# Patient Record
Sex: Female | Born: 1955 | Race: White | Hispanic: No | Marital: Married | Smoking: Never smoker
Health system: Southern US, Community
[De-identification: ages and names within clinical notes are randomized; demographics above are authoritative.]

## PROBLEM LIST (undated history)

## (undated) DIAGNOSIS — Z9889 Other specified postprocedural states: Secondary | ICD-10-CM

## (undated) DIAGNOSIS — E039 Hypothyroidism, unspecified: Secondary | ICD-10-CM

## (undated) DIAGNOSIS — E559 Vitamin D deficiency, unspecified: Secondary | ICD-10-CM

## (undated) DIAGNOSIS — N393 Stress incontinence (female) (male): Secondary | ICD-10-CM

## (undated) DIAGNOSIS — C50919 Malignant neoplasm of unspecified site of unspecified female breast: Secondary | ICD-10-CM

## (undated) DIAGNOSIS — N952 Postmenopausal atrophic vaginitis: Secondary | ICD-10-CM

## (undated) DIAGNOSIS — N951 Menopausal and female climacteric states: Secondary | ICD-10-CM

## (undated) DIAGNOSIS — R112 Nausea with vomiting, unspecified: Secondary | ICD-10-CM

## (undated) DIAGNOSIS — E162 Hypoglycemia, unspecified: Secondary | ICD-10-CM

## (undated) DIAGNOSIS — D069 Carcinoma in situ of cervix, unspecified: Secondary | ICD-10-CM

## (undated) DIAGNOSIS — J309 Allergic rhinitis, unspecified: Secondary | ICD-10-CM

## (undated) DIAGNOSIS — M1990 Unspecified osteoarthritis, unspecified site: Secondary | ICD-10-CM

## (undated) DIAGNOSIS — M8580 Other specified disorders of bone density and structure, unspecified site: Secondary | ICD-10-CM

## (undated) DIAGNOSIS — J302 Other seasonal allergic rhinitis: Secondary | ICD-10-CM

## (undated) DIAGNOSIS — E042 Nontoxic multinodular goiter: Secondary | ICD-10-CM

## (undated) DIAGNOSIS — E7800 Pure hypercholesterolemia, unspecified: Secondary | ICD-10-CM

## (undated) HISTORY — PX: CERVICAL CONE BIOPSY: SUR198

## (undated) HISTORY — DX: Other seasonal allergic rhinitis: J30.2

## (undated) HISTORY — PX: CERVICAL BIOPSY  W/ LOOP ELECTRODE EXCISION: SUR135

## (undated) HISTORY — DX: Menopausal and female climacteric states: N95.1

## (undated) HISTORY — DX: Postmenopausal atrophic vaginitis: N95.2

## (undated) HISTORY — DX: Allergic rhinitis, unspecified: J30.9

## (undated) HISTORY — DX: Other specified disorders of bone density and structure, unspecified site: M85.80

## (undated) HISTORY — DX: Hypothyroidism, unspecified: E03.9

## (undated) HISTORY — DX: Stress incontinence (female) (male): N39.3

## (undated) HISTORY — PX: BREAST LUMPECTOMY: SHX2

## (undated) HISTORY — DX: Nontoxic multinodular goiter: E04.2

## (undated) HISTORY — DX: Malignant neoplasm of unspecified site of unspecified female breast: C50.919

## (undated) HISTORY — DX: Carcinoma in situ of cervix, unspecified: D06.9

## (undated) HISTORY — DX: Unspecified osteoarthritis, unspecified site: M19.90

## (undated) HISTORY — DX: Pure hypercholesterolemia, unspecified: E78.00

## (undated) HISTORY — DX: Vitamin D deficiency, unspecified: E55.9

## (undated) HISTORY — DX: Hypoglycemia, unspecified: E16.2

---

## 2004-07-19 ENCOUNTER — Ambulatory Visit: Payer: Self-pay | Admitting: Unknown Physician Specialty

## 2004-07-20 ENCOUNTER — Ambulatory Visit: Payer: Self-pay | Admitting: Unknown Physician Specialty

## 2006-12-14 ENCOUNTER — Ambulatory Visit: Payer: Self-pay | Admitting: Unknown Physician Specialty

## 2007-03-27 ENCOUNTER — Ambulatory Visit: Payer: Self-pay | Admitting: Unknown Physician Specialty

## 2007-05-09 ENCOUNTER — Ambulatory Visit (HOSPITAL_COMMUNITY): Admission: RE | Admit: 2007-05-09 | Discharge: 2007-05-09 | Payer: Self-pay | Admitting: Gastroenterology

## 2007-05-20 ENCOUNTER — Ambulatory Visit: Payer: Self-pay | Admitting: Gastroenterology

## 2008-05-01 ENCOUNTER — Encounter: Payer: Self-pay | Admitting: Gastroenterology

## 2008-07-03 ENCOUNTER — Telehealth: Payer: Self-pay | Admitting: Gastroenterology

## 2008-08-07 HISTORY — PX: MASTECTOMY: SHX3

## 2008-08-07 HISTORY — PX: OTHER SURGICAL HISTORY: SHX169

## 2008-08-25 ENCOUNTER — Telehealth: Payer: Self-pay | Admitting: Gastroenterology

## 2008-09-15 ENCOUNTER — Encounter: Payer: Self-pay | Admitting: Gastroenterology

## 2010-07-20 ENCOUNTER — Ambulatory Visit: Payer: Self-pay | Admitting: Unknown Physician Specialty

## 2010-08-23 ENCOUNTER — Ambulatory Visit: Payer: Self-pay | Admitting: Unknown Physician Specialty

## 2010-08-25 LAB — PATHOLOGY REPORT

## 2010-09-06 NOTE — Procedures (Signed)
Summary: Recall / [facility]  Recall / [facility]   Imported By: Lennie Odor 01/07/2010 14:13:36  _____________________________________________________________________  External Attachment:    Type:   Image     Comment:   External Document

## 2011-08-28 ENCOUNTER — Ambulatory Visit: Payer: Self-pay | Admitting: Unknown Physician Specialty

## 2012-04-30 DIAGNOSIS — C50919 Malignant neoplasm of unspecified site of unspecified female breast: Secondary | ICD-10-CM | POA: Insufficient documentation

## 2012-10-30 ENCOUNTER — Ambulatory Visit: Payer: Self-pay | Admitting: Unknown Physician Specialty

## 2013-02-19 ENCOUNTER — Ambulatory Visit: Payer: Self-pay | Admitting: Orthopedic Surgery

## 2014-06-25 LAB — HM PAP SMEAR: HM PAP: NEGATIVE

## 2015-05-07 ENCOUNTER — Ambulatory Visit: Payer: Self-pay | Admitting: Physician Assistant

## 2015-05-07 DIAGNOSIS — D510 Vitamin B12 deficiency anemia due to intrinsic factor deficiency: Secondary | ICD-10-CM

## 2015-05-07 MED ORDER — CYANOCOBALAMIN 1000 MCG/ML IJ SOLN
1000.0000 ug | Freq: Once | INTRAMUSCULAR | Status: AC
  Administered 2015-05-07: 1000 ug via INTRAMUSCULAR

## 2015-05-20 ENCOUNTER — Ambulatory Visit (INDEPENDENT_AMBULATORY_CARE_PROVIDER_SITE_OTHER): Payer: PRIVATE HEALTH INSURANCE | Admitting: Obstetrics and Gynecology

## 2015-05-20 ENCOUNTER — Encounter: Payer: Self-pay | Admitting: Obstetrics and Gynecology

## 2015-05-20 VITALS — BP 92/60 | HR 78 | Ht 66.0 in | Wt 131.6 lb

## 2015-05-20 DIAGNOSIS — L298 Other pruritus: Secondary | ICD-10-CM

## 2015-05-20 DIAGNOSIS — Z87410 Personal history of cervical dysplasia: Secondary | ICD-10-CM

## 2015-05-20 DIAGNOSIS — C50919 Malignant neoplasm of unspecified site of unspecified female breast: Secondary | ICD-10-CM | POA: Diagnosis not present

## 2015-05-20 DIAGNOSIS — N898 Other specified noninflammatory disorders of vagina: Secondary | ICD-10-CM

## 2015-05-20 DIAGNOSIS — N952 Postmenopausal atrophic vaginitis: Secondary | ICD-10-CM | POA: Diagnosis not present

## 2015-05-20 DIAGNOSIS — N760 Acute vaginitis: Secondary | ICD-10-CM | POA: Diagnosis not present

## 2015-05-20 DIAGNOSIS — Z780 Asymptomatic menopausal state: Secondary | ICD-10-CM | POA: Insufficient documentation

## 2015-05-20 DIAGNOSIS — N393 Stress incontinence (female) (male): Secondary | ICD-10-CM

## 2015-05-20 DIAGNOSIS — M8580 Other specified disorders of bone density and structure, unspecified site: Secondary | ICD-10-CM | POA: Diagnosis not present

## 2015-05-20 HISTORY — DX: Stress incontinence (female) (male): N39.3

## 2015-05-20 MED ORDER — NYSTATIN-TRIAMCINOLONE 100000-0.1 UNIT/GM-% EX OINT: 1.0000 "application " | TOPICAL_OINTMENT | Freq: Two times a day (BID) | CUTANEOUS | Status: DC

## 2015-05-20 MED ORDER — TERCONAZOLE 0.4 % VA CREA: 1.0000 | TOPICAL_CREAM | Freq: Every day | VAGINAL | Status: DC

## 2015-05-20 NOTE — Patient Instructions (Signed)
1.  The wet prep today did not reveal any obvious source of infection. 2.  A Nu Swab test was sent off. You will be notified of results. 3.  Use the Terazol cream intravaginal daily for 7 days. 4.  Used the Mycolog cream twice a day.  Externally for 14 days. 5.  Return in December for follow-up.

## 2015-05-20 NOTE — Progress Notes (Signed)
Chief complaint: 1.  Recurrent vulvar itching.  SUBJECTIVE: The patient is a 59 year old white female, menopausal, on no HRT therapy,with history of breast cancer, on tamoxifen therapy, who presents for evaluation of recurrent vulvar itching with minimal vaginal discharge.  Several months ago she was treated with an antibiotic.  Since that time she has had recurrent vulvar symptoms, which required treatment with Diflucan twice and Monistat once.  Symptoms typically resolve for a short duration, but then return.  Bladder function is normal.  Bowel function is normal.  Past medical history: Past surgical history, problem list, medications, and allergies are reviewed.  ROS of systems: Per HPI.  OBJECTIVE: BP 92/60 mmHg  Pulse 78  Ht 5\' 6"  (1.676 m)  Wt 131 lb 9.6 oz (59.693 kg)  BMI 21.25 kg/m2 Patient is a pleasant, well-appearing white female in no acute distress. 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  No obvious leukoplakia or abnormal appearing lesions; no epithelial skin breakdown or ulceration; introitus appears normal. BUS: Normal Vagina: Atrophic changes; minimal thin white discharge present.  PROCEDURE: Wet prep-normal saline: Normal epithelial cells, no white blood cells, no Trichomonas.  KOH: No hyphae  IMPRESSION: 1.  Recurrent vulvovaginitis, unclear etiology.  PLAN: 1.  Empiric treatment for monilia: Terazol 7 cream; Mycolog cream. 2.  Return in 1 month.  If symptoms persist. 3.  Return for follow-up in December as scheduled. 4.  Patient may require posterior fourchette biopsy to help confirm diagnosis if symptoms persist.  Brayton Mars, MD

## 2015-05-25 LAB — NUSWAB BV AND CANDIDA, NAA
CANDIDA ALBICANS, NAA: NEGATIVE
CANDIDA GLABRATA, NAA: NEGATIVE

## 2015-06-07 ENCOUNTER — Ambulatory Visit: Payer: Self-pay | Admitting: Physician Assistant

## 2015-06-07 ENCOUNTER — Other Ambulatory Visit: Payer: Self-pay | Admitting: Emergency Medicine

## 2015-06-07 DIAGNOSIS — E538 Deficiency of other specified B group vitamins: Secondary | ICD-10-CM

## 2015-06-07 MED ORDER — CYANOCOBALAMIN 1000 MCG/ML IJ SOLN: 1000.0000 ug | Freq: Once | INTRAMUSCULAR | Status: DC

## 2015-06-07 MED ORDER — CYANOCOBALAMIN 1000 MCG/ML IJ SOLN
1000.0000 ug | Freq: Once | INTRAMUSCULAR | Status: AC
  Administered 2015-06-07: 1000 ug via INTRAMUSCULAR

## 2015-06-07 NOTE — Progress Notes (Signed)
Patient ID: Ashley Hampton, female   DOB: October 02, 1955, 59 y.o.   MRN: [id number] B12 given in the left arm intramuscular.  Pt tolerated it well. Pt will return in 1 month for next scheduled injection.

## 2015-06-07 NOTE — Telephone Encounter (Signed)
Patient is going to pick up medication today and come to clinic for her injection.

## 2015-07-07 ENCOUNTER — Encounter: Payer: Self-pay | Admitting: Physician Assistant

## 2015-07-07 ENCOUNTER — Ambulatory Visit: Payer: Self-pay | Admitting: Physician Assistant

## 2015-07-07 VITALS — BP 100/60 | HR 85 | Temp 98.7°F

## 2015-07-07 DIAGNOSIS — B029 Zoster without complications: Secondary | ICD-10-CM

## 2015-07-07 DIAGNOSIS — E538 Deficiency of other specified B group vitamins: Secondary | ICD-10-CM

## 2015-07-07 MED ORDER — FAMCICLOVIR 500 MG PO TABS
500.0000 mg | ORAL_TABLET | Freq: Three times a day (TID) | ORAL | Status: DC
Start: 2015-07-07 — End: 2015-08-25

## 2015-07-07 MED ORDER — CYANOCOBALAMIN 1000 MCG/ML IJ SOLN
1000.0000 ug | Freq: Once | INTRAMUSCULAR | Status: AC
  Administered 2015-07-07: 1000 ug via INTRAMUSCULAR

## 2015-07-07 NOTE — Addendum Note (Signed)
Addended by: Rudene Anda T on: 07/07/2015 12:33 PM   Modules accepted: Orders

## 2015-07-07 NOTE — Progress Notes (Signed)
S: c/o rash on buttocks b/n cheeks, blistery looking, hx of shingles in past and it was around her waist, area is not very painful, no drainage, fever/chills associated with rash  O: vitals wnl, nad, skin on buttocks with vesicluar rash size of quarter, no drainage, n/v intact  A: shingles  P: famvir 500mg  tid x 7d

## 2015-07-13 ENCOUNTER — Encounter: Payer: Self-pay | Admitting: Obstetrics and Gynecology

## 2015-07-22 ENCOUNTER — Ambulatory Visit (INDEPENDENT_AMBULATORY_CARE_PROVIDER_SITE_OTHER): Payer: PRIVATE HEALTH INSURANCE | Admitting: Obstetrics and Gynecology

## 2015-07-22 ENCOUNTER — Encounter: Payer: Self-pay | Admitting: Obstetrics and Gynecology

## 2015-07-22 VITALS — BP 104/66 | HR 116 | Ht 65.0 in | Wt 139.0 lb

## 2015-07-22 DIAGNOSIS — Z01419 Encounter for gynecological examination (general) (routine) without abnormal findings: Secondary | ICD-10-CM

## 2015-07-22 DIAGNOSIS — Z853 Personal history of malignant neoplasm of breast: Secondary | ICD-10-CM | POA: Diagnosis not present

## 2015-07-22 DIAGNOSIS — R102 Pelvic and perineal pain: Secondary | ICD-10-CM | POA: Diagnosis not present

## 2015-07-22 DIAGNOSIS — Z87410 Personal history of cervical dysplasia: Secondary | ICD-10-CM

## 2015-07-22 NOTE — Progress Notes (Signed)
Patient ID: Ashley Hampton, female   DOB: 1955-11-13, 59 y.o.   MRN: [id number] ANNUAL PREVENTATIVE CARE GYN  ENCOUNTER NOTE  Subjective:       Ashley Hampton is a 59 y.o. G5P1001 female here for a routine annual gynecologic exam.  Current complaints: 1.  Pelvic pain more on left side   Gynecologic History No LMP recorded. Patient is postmenopausal. Contraception: post menopausal status Last Pap: 06/25/2014-[facility]/neg. Results were: abnormal Last mammogram: 2016- thru [facility]. Results were: normal  Obstetric History OB History  Gravida Para Term Preterm AB SAB TAB Ectopic Multiple Living  1 1 1       1     # Outcome Date GA Lbr Len/2nd Weight Sex Delivery Anes PTL Lv  1 Term      Vag-Spont   Y      Past Medical History  Diagnosis Date  . Severe dysplasia of cervix (CIN III)   . Vitamin D deficiency   . Hypoglycemia   . Anemia     pernicious  . Hypothyroid   . AR (allergic rhinitis)   . DJD (degenerative joint disease)   . Osteoporosis   . Multinodular goiter   . Hypercholesteremia   . Menopausal state   . Vaginal atrophy   . SUI (stress urinary incontinence, female) 05/20/2015  . Hypertension   . DJD (degenerative joint disease)   . Osteopenia   . Urinary incontinence   . Breast cancer [facility])     h/o- infiltrating ductal carcinoma    Past Surgical History  Procedure Laterality Date  . Cervical biopsy  w/ loop electrode excision    . Cesarean section    . Cervical cone biopsy    . Breast lumpectomy    . Mastectomy Right 2010    total - infiltrating ductal carcinoma    Current Outpatient Prescriptions on File Prior to Visit  Medication Sig Dispense Refill  . aspirin EC 81 MG tablet Take by mouth.    . calcium elemental as carbonate (PX ANTACID MAXIMUM STRENGTH) 400 MG chewable tablet Chew by mouth.    . celecoxib (CELEBREX) 200 MG capsule Take by mouth.    . cyanocobalamin (,VITAMIN B-12,) 1000 MCG/ML injection Inject 1 mL (1,000 mcg total) into the muscle once. 1 mL  11  . docusate sodium (STOOL SOFTENER) 100 MG capsule Take by mouth.    . famciclovir (FAMVIR) 500 MG tablet Take 1 tablet (500 mg total) by mouth 3 (three) times daily. 21 tablet 0  . fluticasone (FLONASE) 50 MCG/ACT nasal spray     . levothyroxine (SYNTHROID, LEVOTHROID) 88 MCG tablet Take by mouth.    . magnesium oxide (MAG-OX) 400 MG tablet Take by mouth.    . polyethylene glycol powder (GLYCOLAX/MIRALAX) powder Take by mouth.    . pravastatin (PRAVACHOL) 20 MG tablet Take by mouth.    . tamoxifen (NOLVADEX) 10 MG tablet Take by mouth.     No current facility-administered medications on file prior to visit.    Allergies  Allergen Reactions  . Penicillins   . Ampicillin Rash    Social History   Social History  . Marital Status: Married    Spouse Name: N/A  . Number of Children: N/A  . Years of Education: N/A   Occupational History  . Not on file.   Social History Main Topics  . Smoking status: Never Smoker   . Smokeless tobacco: Not on file  . Alcohol Use: Yes     Comment: weekly  .  Drug Use: No  . Sexual Activity: Yes    Birth Control/ Protection: Post-menopausal   Other Topics Concern  . Not on file   Social History Narrative    Family History  Problem Relation Age of Onset  . Lung cancer Mother   . Lung cancer Father   . Ovarian cancer Neg Hx   . Breast cancer Neg Hx   . Cancer Neg Hx   . Diabetes Neg Hx   . Heart disease Neg Hx     The following portions of the patient's history were reviewed and updated as appropriate: allergies, current medications, past family history, past medical history, past social history, past surgical history and problem list.  Review of Systems ROS Review of Systems - General ROS: negative for - chills, fatigue, fever, hot flashes, night sweats, weight gain or weight loss Psychological ROS: negative for - anxiety, decreased libido, depression, mood swings, physical abuse or sexual abuse Ophthalmic ROS: negative for -  blurry vision, eye pain or loss of vision ENT ROS: negative for - headaches, hearing change, visual changes or vocal changes Allergy and Immunology ROS: negative for - hives, itchy/watery eyes or seasonal allergies Hematological and Lymphatic ROS: negative for - bleeding problems, bruising, swollen lymph nodes or weight loss Endocrine ROS: negative for - galactorrhea, hair pattern changes, hot flashes, malaise/lethargy, mood swings, palpitations, polydipsia/polyuria, skin changes, temperature intolerance or unexpected weight changes Breast ROS: negative for - new or changing breast lumps or nipple discharge Respiratory ROS: negative for - cough or shortness of breath Cardiovascular ROS: negative for - chest pain, irregular heartbeat, palpitations or shortness of breath Gastrointestinal ROS: no abdominal pain, change in bowel habits, or black or bloody stools Genito-Urinary ROS: no dysuria, trouble voiding, or hematuria Musculoskeletal ROS: negative for - joint pain or joint stiffness Neurological ROS: negative for - bowel and bladder control changes Dermatological ROS: negative for rash and skin lesion changes   Objective:   BP 104/66 mmHg  Pulse 116  Ht 5\' 5"  (1.651 m)  Wt 139 lb (63.05 kg)  BMI 23.13 kg/m2 CONSTITUTIONAL: Well-developed, well-nourished female in no acute distress.  PSYCHIATRIC: Normal mood and affect. Normal behavior. Normal judgment and thought content. [place]: Alert and oriented to person, place, and time. Normal muscle tone coordination. No cranial nerve deficit noted. HENT:  Normocephalic, atraumatic, External right and left ear normal. Oropharynx is clear and moist EYES: Conjunctivae and EOM are normal. Pupils are equal, round, and reactive to light. No scleral icterus.  NECK: Normal range of motion, supple, no masses.  Normal thyroid.  SKIN: Skin is warm and dry. No rash noted. Not diaphoretic. No erythema. No pallor. CARDIOVASCULAR: Normal heart rate noted,  regular rhythm, no murmur. RESPIRATORY: Clear to auscultation bilaterally. Effort and breath sounds normal, no problems with respiration noted. BREASTS: Right breast reconstruction changes; Nontender, no masses. No masses, skin changes, nipple drainage, or lymphadenopathy. ABDOMEN: Soft, normal bowel sounds, no distention noted.  No tenderness, rebound or guarding.  BLADDER: Normal PELVIC:  External Genitalia: Normal  BUS: Normal  Vagina: Moderate atrophy  Cervix: Scarred; no cervical motion tenderness  Uterus: Normal; Small, mobile, nontender  Adnexa: Normal; Nonpalpable, nontender  RV: External Exam NormaI, No Rectal Masses and Normal Sphincter tone  MUSCULOSKELETAL: Normal range of motion. No tenderness.  No cyanosis, clubbing, or edema.  2+ distal pulses. LYMPHATIC: No Axillary, Supraclavicular, or Inguinal Adenopathy.    Assessment/Plan   Annual gynecologic examination 59 y.o. Contraception: post menopausal status bmi-23 Vaginal atrophy Pelvic  pain, bilateral; normal pelvic exam History of breast cancer. History of CIN-3, status post excision;;Last year Pap smear ASCUS/negative  Pap:Pap smear//HPV Mammogram: thru [facility] Stool Guaiac Testing:  will see dr Tiffany Kocher soon Labs: thru pcp Routine preventative health maintenance measures emphasized: Exercise/Diet/Weight control, Tobacco Warnings and Alcohol/Substance use risks Pelvic ultrasound Lubricants when necessary Return to [place], CMA  Brayton Mars, MD  Note: This dictation was prepared with Dragon dictation along with smaller phrase technology. Any transcriptional errors that result from this process are unintentional.

## 2015-07-23 NOTE — Patient Instructions (Signed)
1.  Pap smear is completed today. 2.  Mammograms through [facility]. 3.  Patient to have colonoscopy; stool guaiac cards are not ven. 4.  Continue with calcium and vitamin D supplementation. 5.  Pelvic ultrasound To assess pelvic pain (pelvic exam normal)  6.  Return in 1 year

## 2015-07-28 LAB — PAP IG AND HPV HIGH-RISK
HPV, high-risk: NEGATIVE
PAP SMEAR COMMENT: 0

## 2015-08-03 ENCOUNTER — Ambulatory Visit (INDEPENDENT_AMBULATORY_CARE_PROVIDER_SITE_OTHER): Payer: PRIVATE HEALTH INSURANCE

## 2015-08-03 DIAGNOSIS — Z853 Personal history of malignant neoplasm of breast: Secondary | ICD-10-CM | POA: Diagnosis not present

## 2015-08-03 DIAGNOSIS — R102 Pelvic and perineal pain: Secondary | ICD-10-CM | POA: Diagnosis not present

## 2015-08-05 ENCOUNTER — Telehealth: Payer: Self-pay | Admitting: Obstetrics and Gynecology

## 2015-08-05 NOTE — Telephone Encounter (Signed)
Pt notified that Dr. Enzo Bi out of office and will return on Tuesday. Msg sent to CM for review.

## 2015-08-05 NOTE — Telephone Encounter (Signed)
Pt came Tuesday for [place] and she wanted to now results

## 2015-08-06 ENCOUNTER — Ambulatory Visit: Payer: Self-pay

## 2015-08-06 DIAGNOSIS — E538 Deficiency of other specified B group vitamins: Secondary | ICD-10-CM

## 2015-08-06 MED ORDER — CYANOCOBALAMIN 1000 MCG/ML IJ SOLN
1000.0000 ug | Freq: Once | INTRAMUSCULAR | Status: AC
  Administered 2015-08-06: 1000 ug via INTRAMUSCULAR

## 2015-08-06 NOTE — Progress Notes (Unsigned)
Patient ID: Ashley Hampton, female   DOB: [birth year], 59 y.o.   MRN: [id number] Patient came in to have her monthly b12 injection.

## 2015-08-10 ENCOUNTER — Ambulatory Visit (INDEPENDENT_AMBULATORY_CARE_PROVIDER_SITE_OTHER): Payer: Managed Care, Other (non HMO) | Admitting: Obstetrics and Gynecology

## 2015-08-10 ENCOUNTER — Telehealth: Payer: Self-pay | Admitting: Obstetrics and Gynecology

## 2015-08-10 ENCOUNTER — Encounter: Payer: Self-pay | Admitting: Obstetrics and Gynecology

## 2015-08-10 VITALS — BP 107/72 | HR 98 | Ht 65.0 in | Wt 138.0 lb

## 2015-08-10 DIAGNOSIS — R102 Pelvic and perineal pain: Secondary | ICD-10-CM | POA: Diagnosis not present

## 2015-08-10 DIAGNOSIS — R938 Abnormal findings on diagnostic imaging of other specified body structures: Secondary | ICD-10-CM

## 2015-08-10 DIAGNOSIS — Z853 Personal history of malignant neoplasm of breast: Secondary | ICD-10-CM | POA: Diagnosis not present

## 2015-08-10 DIAGNOSIS — R9389 Abnormal findings on diagnostic imaging of other specified body structures: Secondary | ICD-10-CM

## 2015-08-10 NOTE — Telephone Encounter (Signed)
Patient called requesting results from her ultrasound.Thanks °

## 2015-08-10 NOTE — Progress Notes (Signed)
Chief complaint: 1.  Pelvic pain. 2.  Pelvic ultrasound follow-up.  Patient currently on tamoxifen for breast cancer. Pelvic pain, left greater than right, has been identified recently;Physical exam normal.  Pelvic ultrasound: Location: [facility] Date of Service: 08/02/16   Indications:Pelvic Pain Findings:  The uterus measures 5.8 x 2.4 x 3.1cm. Echo texture is homogenous without evidence of focal masses. The Endometrium appears thickened and inhomogeneous measuring 8.6 mm.   Right Ovary measures 1.4 x .86 x 1 cm. It is normal in appearance. Left Ovary measures 1.6 x 1 x 1.1 cm. It is normal appearance. Survey of the adnexa demonstrates no adnexal masses.  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 There is no free fluid in the cul de sac.  Impression: 1. The Endometrium appears thickened and inhomogeneous measuring 8.6 mm. 2. 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  Recommendations: 1. Clinical correlation with the patient's History and Physical Exam. 2. Due to the patient's history of breast cancer, and on tamoxifen, I recommended endometrial sampling through endometrial biopsy or hysteroscopy/D&C  Ultrasound findings were discussed.  ASSESSMENT: 1.  Pelvic pain. 2.  Thickened endometrium on ultrasound; pelvic varicosities, left-sided; no adnexal abnormalities. 3.  Colonoscopy workup is scheduled.  PLAN: 1.  Hysteroscopy/D&C-first week of February 2017.  A total of 15 minutes were spent face-to-face with the patient during this encounter and over half of that time dealt with counseling and coordination of care.  Brayton Mars, MD  Note: This dictation was prepared with Dragon dictation along with smaller phrase technology. Any transcriptional errors that result from this process are unintentional.

## 2015-08-10 NOTE — Telephone Encounter (Signed)
Pt called again on 1/3- sent message to mad.

## 2015-08-10 NOTE — Patient Instructions (Signed)
1.  Call back regarding time for hysteroscopy/D&C-early February 2017

## 2015-08-17 ENCOUNTER — Ambulatory Visit: Admit: 2015-08-17 | Payer: Self-pay | Admitting: Unknown Physician Specialty

## 2015-08-17 SURGERY — COLONOSCOPY WITH PROPOFOL
Anesthesia: General

## 2015-08-25 ENCOUNTER — Encounter: Payer: Self-pay | Admitting: *Deleted

## 2015-08-27 ENCOUNTER — Encounter: Payer: Self-pay | Admitting: *Deleted

## 2015-09-01 NOTE — Discharge Instructions (Signed)
[facility] °[facility] °ENDOSCOPIC SINUS SURGERY °[facility] ° °What is Functional Endoscopic Sinus Surgery? ° The Surgery involves making the natural openings of the sinuses larger by removing the bony partitions that separate the sinuses from the nasal cavity.  The natural sinus lining is preserved as much as possible to allow the sinuses to resume normal function after the surgery.  In some patients nasal polyps (excessively swollen lining of the sinuses) may be removed to relieve obstruction of the sinus openings.  The surgery is performed through the nose using lighted scopes, which eliminates the need for incisions on the face.  A septoplasty is a different procedure which is sometimes performed with sinus surgery.  It involves straightening the boy partition that separates the two sides of your nose.  A crooked or deviated septum may need repair if is obstructing the sinuses or nasal airflow.  Turbinate reduction is also often performed during sinus surgery.  The turbinates are bony proturberances from the side walls of the nose which swell and can obstruct the nose in patients with sinus and allergy problems.  Their size can be surgically reduced to help relieve nasal obstruction. ° °What Can Sinus Surgery Do For Me? ° Sinus surgery can reduce the frequency of sinus infections requiring antibiotic treatment.  This can provide improvement in nasal congestion, post-nasal drainage, facial pressure and nasal obstruction.  Surgery will NOT prevent you from ever having an infection again, so it usually only for patients who get infections 4 or more times yearly requiring antibiotics, or for infections that do not clear with antibiotics.  It will not cure nasal allergies, so patients with allergies may still require medication to treat their allergies after surgery. Surgery may improve headaches related to sinusitis, however, some people will continue to  require medication to control sinus headaches related to allergies.  Surgery will do nothing for other forms of headache (migraine, tension or cluster). ° °What Are the Risks of Endoscopic Sinus Surgery? ° Current techniques allow surgery to be performed safely with little risk, however, there are rare complications that patients should be aware of.  Because the sinuses are located around the eyes, there is risk of eye injury, including blindness, though again, this would be quite rare. This is usually a result of bleeding behind the eye during surgery, which puts the vision oat risk, though there are treatments to protect the vision and prevent permanent disrupted by surgery causing a leak of the spinal fluid that surrounds the brain.  More serious complications would include bleeding inside the brain cavity or damage to the brain.  Again, all of these complications are uncommon, and spinal fluid leaks can be safely managed surgically if they occur.  The most common complication of sinus surgery is bleeding from the nose, which may require packing or cauterization of the nose.  Continued sinus have polyps may experience recurrence of the polyps requiring revision surgery.  Alterations of sense of smell or injury to the tear ducts are also rare complications.  ° °What is the Surgery Like, and what is the Recovery? ° The Surgery usually takes a couple of hours to perform, and is usually performed under a general anesthetic (completely asleep).  Patients are usually discharged home after a couple of hours.  Sometimes during surgery it is necessary to pack the nose to control bleeding, and the packing is left in place for 24 - 48 hours, and removed by your surgeon.    If a septoplasty was performed during the procedure, there is often a splint placed which must be removed after 5-7 days.   °Discomfort: Pain is usually mild to moderate, and can be controlled by prescription pain medication or acetaminophen (Tylenol).   Aspirin, Ibuprofen (Advil, Motrin), or Naprosyn (Aleve) should be avoided, as they can cause increased bleeding.  Most patients feel sinus pressure like they have a bad head cold for several days.  Sleeping with your head elevated can help reduce swelling and facial pressure, as can ice packs over the face.  A humidifier may be helpful to keep the mucous and blood from drying in the nose.  ° °Diet: There are no specific diet restrictions, however, you should generally start with clear liquids and a light diet of bland foods because the anesthetic can cause some nausea.  Advance your diet depending on how your stomach feels.  Taking your pain medication with food will often help reduce stomach upset which pain medications can cause. ° °Nasal Saline Irrigation: It is important to remove blood clots and dried mucous from the nose as it is healing.  This is done by having you irrigate the nose at least 3 - 4 times daily with a salt water solution.  We recommend using NeilMed Sinus Rinse (available at the drug store).  Fill the squeeze bottle with the solution, bend over a sink, and insert the tip of the squeeze bottle into the nose ½ of an inch.  Point the tip of the squeeze bottle towards the inside corner of the eye on the same side your irrigating.  Squeeze the bottle and gently irrigate the nose.  If you bend forward as you do this, most of the fluid will flow back out of the nose, instead of down your throat.   The solution should be warm, near body temperature, when you irrigate.   Each time you irrigate, you should use a full squeeze bottle.  ° °Note that if you are instructed to use Nasal Steroid Sprays at any time after your surgery, irrigate with saline BEFORE using the steroid spray, so you do not wash it all out of the nose. °Another product, Nasal Saline Gel (such as AYR Nasal Saline Gel) can be applied in each nostril 3 - 4 times daily to moisture the nose and reduce scabbing or crusting. ° °Bleeding:   Bloody drainage from the nose can be expected for several days, and patients are instructed to irrigate their nose frequently with salt water to help remove mucous and blood clots.  The drainage may be dark red or brown, though some fresh blood may be seen intermittently, especially after irrigation.  Do not blow you nose, as bleeding may occur. If you must sneeze, keep your mouth open to allow air to escape through your mouth. ° °If heavy bleeding occurs: Irrigate the nose with saline to rinse out clots, then spray the nose 3 - 4 times with Afrin Nasal Decongestant Spray.  The spray will constrict the blood vessels to slow bleeding.  Pinch the lower half of your nose shut to apply pressure, and lay down with your head elevated.  Ice packs over the nose may help as well. If bleeding persists despite these measures, you should notify your doctor.  Do not use the Afrin routinely to control nasal congestion after surgery, as it can result in worsening congestion and may affect healing.  ° ° ° °Activity: Return to work varies among patients. Most patients will be   out of work at least 5 - 7 days to recover.  Patient may return to work after they are off of narcotic pain medication, and feeling well enough to perform the functions of their job.  Patients must avoid heavy lifting (over 10 pounds) or strenuous physical for 2 weeks after surgery, so your employer may need to assign you to light duty, or keep you out of work longer if light duty is not possible.  NOTE: you should not drive, operate dangerous machinery, do any mentally demanding tasks or make any important legal or financial decisions while on narcotic pain medication and recovering from the general anesthetic.  °  °Call Your Doctor Immediately if You Have Any of the Following: °1. Bleeding that you cannot control with the above measures °2. Loss of vision, double vision, bulging of the eye or black eyes. °3. Fever over 101 degrees °4. Neck stiffness with  severe headache, fever, nausea and change in mental state. °You are always encourage to call anytime with concerns, however, please call with requests for pain medication refills during office hours. ° °Office Endoscopy: During follow-up visits your doctor will remove any packing or splints that may have been placed and evaluate and clean your sinuses endoscopically.  Topical anesthetic will be used to make this as comfortable as possible, though you may want to take your pain medication prior to the visit.  How often this will need to be done varies from patient to patient.  After complete recovery from the surgery, you may need follow-up endoscopy from time to time, particularly if there is concern of recurrent infection or nasal polyps. ° °General Anesthesia, Adult, Care After °Refer to this sheet in the next few weeks. These instructions provide you with information on caring for yourself after your procedure. Your health care provider may also give you more specific instructions. Your treatment has been planned according to current medical practices, but problems sometimes occur. Call your health care provider if you have any problems or questions after your procedure. °WHAT TO EXPECT AFTER THE PROCEDURE °After the procedure, it is typical to experience: °· Sleepiness. °· Nausea and vomiting. °HOME CARE INSTRUCTIONS °· For the first 24 hours after general anesthesia: °¨ Have a responsible person with you. °¨ Do not drive a car. If you are alone, do not take public transportation. °¨ Do not drink alcohol. °¨ Do not take medicine that has not been prescribed by your health care provider. °¨ Do not sign important papers or make important decisions. °¨ You may resume a normal diet and activities as directed by your health care provider. °· Change bandages (dressings) as directed. °· If you have questions or problems that seem related to general anesthesia, call the hospital and ask for the anesthetist or  anesthesiologist on call. °SEEK MEDICAL CARE IF: °· You have nausea and vomiting that continue the day after anesthesia. °· You develop a rash. °SEEK IMMEDIATE MEDICAL CARE IF:  °· You have difficulty breathing. °· You have chest pain. °· You have any allergic problems. °  °This information is not intended to replace advice given to you by your health care provider. Make sure you discuss any questions you have with your health care provider. °  °Document Released: 10/30/2000 Document Revised: 08/14/2014 Document Reviewed: 11/22/2011 °Elsevier Interactive Patient Education ©2016 [employer] ° °

## 2015-09-02 ENCOUNTER — Encounter
Admission: RE | Disposition: A | Discharge status: Home / Self Care | Payer: Self-pay | Source: Ambulatory Visit | Attending: Otolaryngology

## 2015-09-02 ENCOUNTER — Ambulatory Visit: Payer: Managed Care, Other (non HMO) | Admitting: Anesthesiology

## 2015-09-02 ENCOUNTER — Ambulatory Visit
Admission: RE | Admit: 2015-09-02 | Discharge: 2015-09-02 | Disposition: A | Discharge status: Home / Self Care | Payer: Managed Care, Other (non HMO) | Source: Ambulatory Visit | Attending: Otolaryngology | Admitting: Otolaryngology

## 2015-09-02 DIAGNOSIS — J342 Deviated nasal septum: Secondary | ICD-10-CM | POA: Insufficient documentation

## 2015-09-02 DIAGNOSIS — I10 Essential (primary) hypertension: Secondary | ICD-10-CM | POA: Insufficient documentation

## 2015-09-02 DIAGNOSIS — Z809 Family history of malignant neoplasm, unspecified: Secondary | ICD-10-CM | POA: Diagnosis not present

## 2015-09-02 DIAGNOSIS — E039 Hypothyroidism, unspecified: Secondary | ICD-10-CM | POA: Diagnosis not present

## 2015-09-02 DIAGNOSIS — Z853 Personal history of malignant neoplasm of breast: Secondary | ICD-10-CM | POA: Insufficient documentation

## 2015-09-02 DIAGNOSIS — E7800 Pure hypercholesterolemia, unspecified: Secondary | ICD-10-CM | POA: Diagnosis not present

## 2015-09-02 DIAGNOSIS — E785 Hyperlipidemia, unspecified: Secondary | ICD-10-CM | POA: Diagnosis not present

## 2015-09-02 DIAGNOSIS — J329 Chronic sinusitis, unspecified: Secondary | ICD-10-CM | POA: Diagnosis not present

## 2015-09-02 DIAGNOSIS — Z8349 Family history of other endocrine, nutritional and metabolic diseases: Secondary | ICD-10-CM | POA: Insufficient documentation

## 2015-09-02 DIAGNOSIS — Z79899 Other long term (current) drug therapy: Secondary | ICD-10-CM | POA: Diagnosis not present

## 2015-09-02 DIAGNOSIS — J343 Hypertrophy of nasal turbinates: Secondary | ICD-10-CM | POA: Diagnosis not present

## 2015-09-02 DIAGNOSIS — Z881 Allergy status to other antibiotic agents status: Secondary | ICD-10-CM | POA: Diagnosis not present

## 2015-09-02 HISTORY — PX: IMAGE GUIDED SINUS SURGERY: SHX6570

## 2015-09-02 HISTORY — DX: Nausea with vomiting, unspecified: R11.2

## 2015-09-02 HISTORY — DX: Other specified postprocedural states: Z98.890

## 2015-09-02 HISTORY — PX: MAXILLARY ANTROSTOMY: SHX2003

## 2015-09-02 HISTORY — PX: SEPTOPLASTY: SHX2393

## 2015-09-02 HISTORY — PX: ETHMOIDECTOMY: SHX5197

## 2015-09-02 SURGERY — SINUS SURGERY, WITH IMAGING GUIDANCE
Anesthesia: General | Site: Nose | Wound class: Clean Contaminated

## 2015-09-02 MED ORDER — FENTANYL CITRATE (PF) 100 MCG/2ML IJ SOLN: 25.0000 ug | INTRAMUSCULAR | Status: DC | PRN

## 2015-09-02 MED ORDER — PROPOFOL 10 MG/ML IV BOLUS
INTRAVENOUS | Status: DC | PRN
  Administered 2015-09-02: 120 mg via INTRAVENOUS

## 2015-09-02 MED ORDER — LACTATED RINGERS IV SOLN
INTRAVENOUS | Status: DC
Start: 2015-09-02 — End: 2015-09-02
  Administered 2015-09-02: 12:00:00 via INTRAVENOUS

## 2015-09-02 MED ORDER — GLYCOPYRROLATE 0.2 MG/ML IJ SOLN
INTRAMUSCULAR | Status: DC | PRN
  Administered 2015-09-02: 0.1 mg via INTRAVENOUS

## 2015-09-02 MED ORDER — DEXAMETHASONE SODIUM PHOSPHATE 4 MG/ML IJ SOLN
INTRAMUSCULAR | Status: DC | PRN
  Administered 2015-09-02: 8 mg via INTRAVENOUS

## 2015-09-02 MED ORDER — ACETAMINOPHEN 10 MG/ML IV SOLN
1000.0000 mg | Freq: Once | INTRAVENOUS | Status: AC
  Administered 2015-09-02: 1000 mg via INTRAVENOUS

## 2015-09-02 MED ORDER — FENTANYL CITRATE (PF) 100 MCG/2ML IJ SOLN
INTRAMUSCULAR | Status: DC | PRN
  Administered 2015-09-02: 50 ug via INTRAVENOUS

## 2015-09-02 MED ORDER — LIDOCAINE HCL 4 % MT SOLN
OROMUCOSAL | Status: DC | PRN
  Administered 2015-09-02: 4 mL via TOPICAL

## 2015-09-02 MED ORDER — SUCCINYLCHOLINE CHLORIDE 20 MG/ML IJ SOLN
INTRAMUSCULAR | Status: DC | PRN
  Administered 2015-09-02: 100 mg via INTRAVENOUS

## 2015-09-02 MED ORDER — SCOPOLAMINE 1 MG/3DAYS TD PT72
1.0000 | MEDICATED_PATCH | Freq: Once | TRANSDERMAL | Status: DC
  Administered 2015-09-02: 1.5 mg via TRANSDERMAL

## 2015-09-02 MED ORDER — OXYCODONE HCL 5 MG PO TABS
5.0000 mg | ORAL_TABLET | Freq: Once | ORAL | Status: AC
  Administered 2015-09-02: 5 mg via ORAL

## 2015-09-02 MED ORDER — OXYMETAZOLINE HCL 0.05 % NA SOLN
2.0000 | Freq: Once | NASAL | Status: AC
  Administered 2015-09-02: 2 via NASAL

## 2015-09-02 MED ORDER — LIDOCAINE HCL (CARDIAC) 20 MG/ML IV SOLN
INTRAVENOUS | Status: DC | PRN
  Administered 2015-09-02: 50 mg via INTRAVENOUS

## 2015-09-02 MED ORDER — PHENYLEPHRINE HCL 0.5 % NA SOLN
NASAL | Status: DC | PRN
  Administered 2015-09-02: 30 mL via TOPICAL

## 2015-09-02 MED ORDER — CEFAZOLIN SODIUM 1-5 GM-% IV SOLN
1.0000 g | Freq: Once | INTRAVENOUS | Status: AC
  Administered 2015-09-02: 1 g via INTRAVENOUS

## 2015-09-02 MED ORDER — MIDAZOLAM HCL 5 MG/5ML IJ SOLN
INTRAMUSCULAR | Status: DC | PRN
  Administered 2015-09-02: 2 mg via INTRAVENOUS

## 2015-09-02 MED ORDER — LIDOCAINE-EPINEPHRINE 1 %-1:100000 IJ SOLN
INTRAMUSCULAR | Status: DC | PRN
  Administered 2015-09-02: 5.5 mL

## 2015-09-02 MED ORDER — ONDANSETRON HCL 4 MG/2ML IJ SOLN
INTRAMUSCULAR | Status: DC | PRN
  Administered 2015-09-02: 4 mg via INTRAVENOUS

## 2015-09-02 MED ORDER — ONDANSETRON HCL 4 MG/2ML IJ SOLN: 4.0000 mg | Freq: Once | INTRAMUSCULAR | Status: DC | PRN

## 2015-09-02 SURGICAL SUPPLY — 36 items
BALLOON SINUPLASTY SYSTEM (BALLOONS) IMPLANT
BATTERY INSTRU NAVIGATION (MISCELLANEOUS) ×16 IMPLANT
CANISTER SUCT 1200ML W/VALVE (MISCELLANEOUS) ×4 IMPLANT
CATH IV 18X1 1/4 SAFELET (CATHETERS) ×4 IMPLANT
COAG SUCT 10F 3.5MM HAND CTRL (MISCELLANEOUS) IMPLANT
COAGULATOR SUCT 8FR VV (MISCELLANEOUS) ×4 IMPLANT
DEVICE INFLATION SEID (MISCELLANEOUS) IMPLANT
DRAPE HEAD BAR (DRAPES) ×4 IMPLANT
GLOVE PI ULTRA LF STRL 7.5 (GLOVE) ×6 IMPLANT
GLOVE PI ULTRA NON LATEX 7.5 (GLOVE) ×6
IRRIGATOR 4MM STR (IRRIGATION / IRRIGATOR) ×4 IMPLANT
IV CATH 18X1 1/4 SAFELET (CATHETERS) ×2
IV NS 500ML (IV SOLUTION) ×2
IV NS 500ML BAXH (IV SOLUTION) ×2 IMPLANT
KIT ROOM TURNOVER OR (KITS) ×4 IMPLANT
NAVIGATION MASK REG  ST (MISCELLANEOUS) ×4 IMPLANT
NEEDLE HYPO 25GX1X1/2 BEV (NEEDLE) ×4 IMPLANT
NEEDLE SPNL 25GX3.5 QUINCKE BL (NEEDLE) ×4 IMPLANT
NS IRRIG 500ML POUR BTL (IV SOLUTION) ×4 IMPLANT
PACK DRAPE NASAL/ENT (PACKS) ×4 IMPLANT
PACKING NASAL EPIS 4X2.4 XEROG (MISCELLANEOUS) ×8 IMPLANT
PAD GROUND ADULT SPLIT (MISCELLANEOUS) ×4 IMPLANT
PATTIES SURGICAL .5 X3 (DISPOSABLE) ×4 IMPLANT
SET HANDPIECE IRR DIEGO (MISCELLANEOUS) ×4 IMPLANT
SOL ANTI-FOG 6CC FOG-OUT (MISCELLANEOUS) ×2 IMPLANT
SOL FOG-OUT ANTI-FOG 6CC (MISCELLANEOUS) ×2
SPLINT NASAL SEPTAL BLV .50 ST (MISCELLANEOUS) ×4 IMPLANT
STRAP BODY AND KNEE 60X3 (MISCELLANEOUS) ×8 IMPLANT
SUT CHROMIC 3-0 (SUTURE)
SUT CHROMIC 3-0 KS 27XMFL CR (SUTURE)
SUT ETHILON 3-0 KS 30 BLK (SUTURE) ×4 IMPLANT
SUT PLAIN GUT 4-0 (SUTURE) ×4 IMPLANT
SUTURE CHRMC 3-0 KS 27XMFL CR (SUTURE) IMPLANT
SYR 3ML LL SCALE MARK (SYRINGE) ×4 IMPLANT
TOWEL OR 17X26 4PK STRL BLUE (TOWEL DISPOSABLE) ×4 IMPLANT
WATER STERILE IRR 500ML POUR (IV SOLUTION) ×4 IMPLANT

## 2015-09-02 NOTE — Transfer of Care (Signed)
Immediate Anesthesia Transfer of Care Note  Patient: Ashley Hampton  Procedure(s) Performed: Procedure(s) with comments: IMAGE GUIDED SINUS SURGERY (N/A) - gave disk to cece  SEPTOPLASTY (N/A) ENDOSCOPIC MAXILLARY ANTROSTOMY  (Bilateral)  TOTAL ETHMOIDECTOMY (Bilateral)  Patient Location: PACU  Anesthesia Type: General ETT  Level of Consciousness: awake, alert  and patient cooperative  Airway and Oxygen Therapy: Patient Spontanous Breathing and Patient connected to supplemental oxygen  Post-op Assessment: Post-op Vital signs reviewed, Patient's Cardiovascular Status Stable, Respiratory Function Stable, Patent Airway and No signs of Nausea or vomiting  Post-op Vital Signs: Reviewed and stable  Complications: No apparent anesthesia complications

## 2015-09-02 NOTE — Anesthesia Preprocedure Evaluation (Signed)
Anesthesia Evaluation  Patient identified by MRN, date of birth, ID band  Reviewed: Allergy & Precautions, H&P , NPO status , Patient's Chart, lab work & pertinent test results  History of Anesthesia Complications (+) PONV and history of anesthetic complications  Airway Mallampati: II  TM Distance: >3 FB Neck ROM: full    Dental no notable dental hx.    Pulmonary    Pulmonary exam normal        Cardiovascular hypertension,  Rhythm:regular Rate:Normal     Neuro/Psych    GI/Hepatic   Endo/Other  Hypothyroidism   Renal/GU      Musculoskeletal   Abdominal   Peds  Hematology   Anesthesia Other Findings   Reproductive/Obstetrics                             Anesthesia Physical Anesthesia Plan  ASA: II  Anesthesia Plan: General ETT   Post-op Pain Management:    Induction:   Airway Management Planned:   Additional Equipment:   Intra-op Plan:   Post-operative Plan:   Informed Consent: I have reviewed the patients History and Physical, chart, labs and discussed the procedure including the risks, benefits and alternatives for the proposed anesthesia with the patient or authorized representative who has indicated his/her understanding and acceptance.     Plan Discussed with: CRNA  Anesthesia Plan Comments:         Anesthesia Quick Evaluation

## 2015-09-02 NOTE — Anesthesia Postprocedure Evaluation (Signed)
Anesthesia Post Note  Patient: Ashley Hampton  Procedure(s) Performed: Procedure(s) (LRB): IMAGE GUIDED SINUS SURGERY (N/A) SEPTOPLASTY (N/A) ENDOSCOPIC MAXILLARY ANTROSTOMY  (Bilateral)  TOTAL ETHMOIDECTOMY (Bilateral)  Patient location during evaluation: PACU Anesthesia Type: General Level of consciousness: awake and alert and oriented Pain management: satisfactory to patient Vital Signs Assessment: post-procedure vital signs reviewed and stable Respiratory status: spontaneous breathing, nonlabored ventilation and respiratory function stable Cardiovascular status: blood pressure returned to baseline and stable Postop Assessment: Adequate PO intake and No signs of nausea or vomiting Anesthetic complications: no    Raliegh Ip

## 2015-09-02 NOTE — H&P (Signed)
  H&P has been reviewed and no changes necessary. To be downloaded later. 

## 2015-09-02 NOTE — Op Note (Signed)
09/02/2015  3:41 PM    Ashley Hampton  [id number]   Pre-Op Dx:  Septal deviation, chronic bilateral maxillary sinusitis, chronic bilateral ethmoid sinusitis, conchal bullosa of the middle turbinates  Post-op Dx: Same  Proc: Septoplasty, bilateral endoscopic maxillary antrostomies, bilateral endoscopic total ethmoidectomies, outfracture inferior turbinates, use of image guided system   Surg:  [name]  Anes:  GOT  EBL:  150 mL  Comp:  None  Findings:  Large conchal bullosa on the right middle turbinate with the septum deviated markedly to the left. Smaller conchal bullosa of the left middle turbinate. Mucopurulent drainage in both maxillary sinuses and the left ethmoid  Procedure: The patient was given general anesthesia by oral endotracheal intubation. The nose was prepped using 6 mL of 1% Xylocaine with epi 1-100,000 for infiltration of the nasal septum and the lateral nasal walls. Cottonoid pledgets soaked in phenylephrine and Xylocaine were used for packing the nose. The image guided system was brought in and the CT scan was downloaded to the disc. The template was applied to the face. The template was then registered to the system and there was 0.8 mm of variance. The suction inserts were then registered to the system and the alignment was off slightly. The template was reregistered and this time the alignment was appropriate. The patient was prepped and draped sterile fashion.  The 0 scope was used to visualize the nose. The septum was deviated to the left side blocking most the airway here. There was a large vomer spur to the right side posteriorly obstructing the posterior airway on the right. A left Killian incision was created with elevation of mucoperichondrium the left side of the quadrangular plate. The bony cartilaginous junction was split and the mucoperiosteum was elevated on both sides the ethmoid and vomer. The crooked ethmoid plate was fractured and removed and then  the crooked vomer was fractured and removed as well. This allowed the quadrangular plate to slide back towards the midline. The mucosal flaps were placed back in the airway was much more open on both sides except for the large conchal bullosa of the right middle turbinate. The mucosal flaps were sutured with a 40 plain gut suture in a through and through whip stitch fashion. This was used to close the [place] incision as well.  The 0 scope was used again the left side and the left middle turbinate was infractured. The concha bullosa was opened and the lateral wall removed on the middle turbinate. The uncinate process was incised and the left maxillary antrum was opened. There was thick mucopurulent drainage coming from the sinus. This was all suctioned out. The image guided system was used to evaluate the and depth of dissection. 30 scope was used to make sure the maxillary antrum was widely opened including the natural ostium. The 0 scope was used again for opening the ethmoid bulla and the posterior ethmoid air cells. There was some mucopurulent drainage in the ethmoids as well. Microdebrider was used to remove the rough edges and help open up the sinuses. The 30 scope was used to open up the anterior ethmoid air cells as well and make sure these were cleaned out. The frontal sinus duct was medial and there is a large auger nasi cell that be easily seen. The needle wall of this was fractured and most of it removed to widen the opening to the frontal sinus duct. A cottonoid pledget was placed here temporarily for vasoconstriction while the right side was addressed.  The 0 scope was used to visualize the nose. The middle turbinate was very large blocking middle meatus and the entire upper airway. [place] forceps was used for opening the anterior face and removing the lateral wall of conchal bullosa. The bottom one half of the turbinate was left intact and the [facility] microdebrider was used along here to  help fashion the remaining turbinate and smoothing it down. The side biter was used for incising the uncinate process and removing this. His was done with the [place] as well. Branch was open. The 30 scope was used to make sure the natural ostium was opened and included in this. His wide enough to clean out the sinus. There was thick mucopurulent drainage in the right side as well but not as much as the left. His was all suctioned clear. The degrees scope was used for opening up the ethmoid bulla and the middle and posterior ethmoid air cells. Image guided system was used to evaluate the depth of dissection make sure that all of the sinus opening. The the ethmoidalis was followed anteriorly to open up the anterior ethmoid air cells. 30 scope was used here to lyse this area better. The auger nasi cell was clearly up again in the frontal sinus duct was medial to this. The common wall was taken down to widen the opening to the frontal sinus duct. A cottonoid pledget was placed here for vasoconstriction temporarily.  The left side was visualized again there is no further evidence of disease in the ethmoids or the maxillary sinus small clots were removed. Xerogel was placed into the ethmoid sinus area. The inferior turbinate was outfractured right side was revisualized and again there was no evidence of further ethmoid maxillary sinus disease sinuses were all open small clots were suctioned out. The ethmoid was filled with xerogel this covered over the turbinate remnant as well. Xomed 0.5 mm thickness regular sized splints were trimmed and placed on both sides the septum and held in place with a 3-0 nylon suture. inferior turbinates were outfractured on both sides to help open up the airway further.  The patient tolerated procedure well there were no operative complications. Total estimated blood loss 150 mL  Dispo:   She was transferred to the PACU in satisfactory condition. She was to be discharged  home from there.  Plan:  Follow-up in the office in 6 days. Rest at home with head elevated. We'll increase diet as tolerated. call if any challenges.  Delmore Sear H  09/02/2015 3:41 PM

## 2015-09-02 NOTE — Anesthesia Procedure Notes (Signed)
Procedure Name: Intubation Date/Time: 09/02/2015 1:43 PM Performed by: Mayme Genta Pre-anesthesia Checklist: Patient identified, Emergency Drugs available, Suction available, Patient being monitored and Timeout performed Patient Re-evaluated:Patient Re-evaluated prior to inductionOxygen Delivery Method: Circle system utilized Preoxygenation: Pre-oxygenation with 100% oxygen Intubation Type: IV induction Ventilation: Mask ventilation without difficulty Laryngoscope Size: Miller and 2 Grade View: Grade I Tube type: Oral Rae Tube size: 7.0 mm Number of attempts: 1 Placement Confirmation: ETT inserted through vocal cords under direct vision,  positive ETCO2 and breath sounds checked- equal and bilateral Tube secured with: Tape Dental Injury: Teeth and Oropharynx as per pre-operative assessment

## 2015-09-03 ENCOUNTER — Encounter: Payer: Self-pay | Admitting: Otolaryngology

## 2015-09-06 ENCOUNTER — Ambulatory Visit: Payer: Self-pay | Admitting: Physician Assistant

## 2015-09-06 DIAGNOSIS — E538 Deficiency of other specified B group vitamins: Secondary | ICD-10-CM

## 2015-09-06 LAB — SURGICAL PATHOLOGY

## 2015-09-06 MED ORDER — CYANOCOBALAMIN 1000 MCG/ML IJ SOLN
1000.0000 ug | Freq: Once | INTRAMUSCULAR | Status: AC
  Administered 2015-09-06: 1000 ug via INTRAMUSCULAR

## 2015-09-06 NOTE — Progress Notes (Signed)
Patient ID: Ashley Hampton, female   DOB: 1955-09-02, 60 y.o.   MRN: [id number] Patient came to get her scheduled monthly vitamin B12 injection.

## 2015-10-07 ENCOUNTER — Ambulatory Visit: Payer: Self-pay | Admitting: Family

## 2015-10-07 DIAGNOSIS — E538 Deficiency of other specified B group vitamins: Secondary | ICD-10-CM

## 2015-10-07 MED ORDER — CYANOCOBALAMIN 1000 MCG/ML IJ SOLN
1000.0000 ug | Freq: Once | INTRAMUSCULAR | Status: AC
  Administered 2015-10-07: 1000 ug via INTRAMUSCULAR

## 2015-10-07 NOTE — Progress Notes (Signed)
Patient ID: Ashley Hampton, female   DOB: 30-Dec-1955, 60 y.o.   MRN: [id number] Patient came in to have her scheduled b12 injection done.

## 2015-11-08 ENCOUNTER — Ambulatory Visit: Payer: Self-pay | Admitting: Physician Assistant

## 2015-11-08 DIAGNOSIS — E538 Deficiency of other specified B group vitamins: Secondary | ICD-10-CM

## 2015-11-08 MED ORDER — CYANOCOBALAMIN 1000 MCG/ML IJ SOLN
1000.0000 ug | Freq: Once | INTRAMUSCULAR | Status: AC
  Administered 2015-11-08: 1000 ug via INTRAMUSCULAR

## 2015-11-08 NOTE — Progress Notes (Signed)
Patient ID: Ashley Hampton, female   DOB: [birth year], 60 y.o.   MRN: [id number] Patient came in to have her monthly Vitamin B12 injection.

## 2015-11-25 ENCOUNTER — Other Ambulatory Visit: Payer: Self-pay | Admitting: Physician Assistant

## 2015-12-10 ENCOUNTER — Other Ambulatory Visit: Payer: Self-pay | Admitting: Emergency Medicine

## 2015-12-10 MED ORDER — FLUTICASONE PROPIONATE 50 MCG/ACT NA SUSP: 2.0000 | Freq: Every day | NASAL | Status: DC

## 2015-12-10 MED ORDER — PRAVASTATIN SODIUM 20 MG PO TABS: 20.0000 mg | ORAL_TABLET | Freq: Every day | ORAL | Status: DC

## 2015-12-10 NOTE — Telephone Encounter (Signed)
Patient called and requested a refill for her Prevastatin 20mg  and Flonase.  She wants the script sent to [facility] in [place].

## 2015-12-10 NOTE — Telephone Encounter (Signed)
Med refill approved, was cancelled due to mail order so reissued

## 2015-12-15 ENCOUNTER — Ambulatory Visit: Payer: Self-pay | Admitting: Physician Assistant

## 2015-12-15 DIAGNOSIS — D519 Vitamin B12 deficiency anemia, unspecified: Secondary | ICD-10-CM

## 2015-12-15 MED ORDER — CYANOCOBALAMIN 1000 MCG/ML IJ SOLN
1000.0000 ug | Freq: Once | INTRAMUSCULAR | Status: AC
  Administered 2015-12-15: 1000 ug via INTRAMUSCULAR

## 2015-12-15 NOTE — Progress Notes (Signed)
Pt here for monthly b12 injection

## 2016-01-11 ENCOUNTER — Ambulatory Visit: Payer: Self-pay | Admitting: Physician Assistant

## 2016-01-11 DIAGNOSIS — E538 Deficiency of other specified B group vitamins: Secondary | ICD-10-CM

## 2016-01-11 MED ORDER — CYANOCOBALAMIN 1000 MCG/ML IJ SOLN
1000.0000 ug | Freq: Once | INTRAMUSCULAR | Status: AC
  Administered 2016-01-11: 1000 ug via INTRAMUSCULAR

## 2016-01-11 NOTE — Progress Notes (Signed)
Patient ID: Ashley Hampton, female   DOB: September 23, 1955, 60 y.o.   MRN: [id number] Patient came in to have her scheduled monthly B12 injection.

## 2016-02-03 ENCOUNTER — Encounter: Payer: Self-pay | Admitting: Physician Assistant

## 2016-02-03 ENCOUNTER — Ambulatory Visit: Payer: Self-pay | Admitting: Physician Assistant

## 2016-02-03 VITALS — BP 100/70 | HR 114 | Temp 98.3°F

## 2016-02-03 DIAGNOSIS — J018 Other acute sinusitis: Secondary | ICD-10-CM

## 2016-02-03 MED ORDER — CEFDINIR 300 MG PO CAPS: 300.0000 mg | ORAL_CAPSULE | Freq: Two times a day (BID) | ORAL | Status: DC

## 2016-02-03 MED ORDER — FLUCONAZOLE 150 MG PO TABS: ORAL_TABLET | ORAL | Status: DC

## 2016-02-03 NOTE — Progress Notes (Signed)
S: C/o runny nose and congestion for 5 days, no fever, chills, cp/sob, v/d; mucus is yellow and thick, cough is sporadic, c/o of facial and dental pain. Also ?if could get rx for shingles vaccine  Using otc meds:   O: PE: vitals wnl, nad, perrl eomi, normocephalic, tms dull, nasal mucosa red and swollen, throat injected, neck supple no lymph, lungs c t a, cv rrr, neuro intact  A:  Acute sinusitis   P: omnicef 300mg  bid x 10d, diflucan, rx for shingles vaccine given; drink fluids, continue regular meds , use otc meds of choice, return if not improving in 5 days, return earlier if worsening

## 2016-02-11 ENCOUNTER — Ambulatory Visit: Payer: Self-pay | Admitting: Physician Assistant

## 2016-02-11 DIAGNOSIS — E538 Deficiency of other specified B group vitamins: Secondary | ICD-10-CM

## 2016-02-11 MED ORDER — CYANOCOBALAMIN 1000 MCG/ML IJ SOLN
1000.0000 ug | Freq: Once | INTRAMUSCULAR | Status: AC
  Administered 2016-02-11: 1000 ug via INTRAMUSCULAR

## 2016-02-11 NOTE — Progress Notes (Signed)
Patient ID: Ashley Hampton, female   DOB: [birth year], 60 y.o.   MRN: [id number] Patient came in to have her monthly b12 injection.

## 2016-03-10 ENCOUNTER — Other Ambulatory Visit: Payer: Self-pay | Admitting: Physician Assistant

## 2016-03-14 ENCOUNTER — Ambulatory Visit: Payer: Self-pay | Admitting: Physician Assistant

## 2016-03-14 VITALS — BP 110/75 | HR 99 | Temp 98.5°F

## 2016-03-14 DIAGNOSIS — D519 Vitamin B12 deficiency anemia, unspecified: Secondary | ICD-10-CM

## 2016-03-14 MED ORDER — CYANOCOBALAMIN 1000 MCG/ML IJ SOLN
1000.0000 ug | Freq: Once | INTRAMUSCULAR | Status: AC
  Administered 2016-03-14: 1000 ug via INTRAMUSCULAR

## 2016-03-14 NOTE — Progress Notes (Signed)
Here for b12 injection

## 2016-04-17 ENCOUNTER — Ambulatory Visit: Payer: Self-pay | Admitting: Physician Assistant

## 2016-04-17 DIAGNOSIS — E538 Deficiency of other specified B group vitamins: Secondary | ICD-10-CM

## 2016-04-17 MED ORDER — CYANOCOBALAMIN 1000 MCG/ML IJ SOLN: 1000.0000 ug | INTRAMUSCULAR | 11 refills | Status: DC

## 2016-04-17 MED ORDER — CYANOCOBALAMIN 1000 MCG/ML IJ SOLN
1000.0000 ug | Freq: Once | INTRAMUSCULAR | Status: AC
  Administered 2016-04-17: 1000 ug via INTRAMUSCULAR

## 2016-04-17 NOTE — Progress Notes (Signed)
Patient came in to have her Vitamin B12 injection.  Pt expressed that she will need a refill for her b12 sent to [facility] on [address].

## 2016-04-17 NOTE — Addendum Note (Signed)
Addended by: Versie Starks on: 04/17/2016 02:02 PM   Modules accepted: Orders

## 2016-05-15 ENCOUNTER — Ambulatory Visit: Payer: Self-pay | Admitting: Physician Assistant

## 2016-05-15 DIAGNOSIS — E538 Deficiency of other specified B group vitamins: Secondary | ICD-10-CM

## 2016-05-15 MED ORDER — CYANOCOBALAMIN 1000 MCG/ML IJ SOLN
1000.0000 ug | Freq: Once | INTRAMUSCULAR | Status: AC
  Administered 2016-05-15: 1000 ug via INTRAMUSCULAR

## 2016-05-15 NOTE — Progress Notes (Signed)
Patient came in to have her monthly B12 injection.

## 2016-06-12 ENCOUNTER — Ambulatory Visit: Payer: Self-pay | Admitting: Physician Assistant

## 2016-06-12 DIAGNOSIS — E538 Deficiency of other specified B group vitamins: Secondary | ICD-10-CM

## 2016-06-12 MED ORDER — CYANOCOBALAMIN 1000 MCG/ML IJ SOLN
1000.0000 ug | Freq: Once | INTRAMUSCULAR | Status: AC
  Administered 2016-06-12: 1000 ug via INTRAMUSCULAR

## 2016-06-12 NOTE — Progress Notes (Signed)
Patient came in to have her monthly B12 injection.

## 2016-06-16 ENCOUNTER — Encounter: Payer: Self-pay | Admitting: Physician Assistant

## 2016-06-16 ENCOUNTER — Ambulatory Visit: Payer: Self-pay | Admitting: Physician Assistant

## 2016-06-16 DIAGNOSIS — Z299 Encounter for prophylactic measures, unspecified: Secondary | ICD-10-CM

## 2016-06-16 NOTE — Progress Notes (Signed)
Patient came in to have blood drawn for testing per Susan's authorization. 

## 2016-06-17 LAB — CMP12+LP+TP+TSH+6AC+CBC/D/PLT
ALBUMIN: 4.2 g/dL (ref 3.6–4.8)
ALT: 11 IU/L (ref 0–32)
AST: 22 IU/L (ref 0–40)
Albumin/Globulin Ratio: 1.8 (ref 1.2–2.2)
Alkaline Phosphatase: 67 IU/L (ref 39–117)
BASOS ABS: 0 10*3/uL (ref 0.0–0.2)
BASOS: 1 %
BUN / CREAT RATIO: 19 (ref 12–28)
BUN: 18 mg/dL (ref 8–27)
Bilirubin Total: 0.3 mg/dL (ref 0.0–1.2)
CALCIUM: 9.2 mg/dL (ref 8.7–10.3)
CHOL/HDL RATIO: 3.1 ratio (ref 0.0–4.4)
CHOLESTEROL TOTAL: 193 mg/dL (ref 100–199)
CREATININE: 0.94 mg/dL (ref 0.57–1.00)
Chloride: 101 mmol/L (ref 96–106)
EOS (ABSOLUTE): 0.2 10*3/uL (ref 0.0–0.4)
EOS: 2 %
Free Thyroxine Index: 2.3 (ref 1.2–4.9)
GFR, EST AFRICAN AMERICAN: 75 mL/min/{1.73_m2} (ref >59)
GFR, EST NON AFRICAN AMERICAN: 65 mL/min/{1.73_m2} (ref >59)
GGT: 7 IU/L (ref 0–60)
GLOBULIN, TOTAL: 2.3 g/dL (ref 1.5–4.5)
Glucose: 86 mg/dL (ref 65–99)
HDL: 62 mg/dL (ref >39)
HEMATOCRIT: 40.3 % (ref 34.0–46.6)
HEMOGLOBIN: 13.4 g/dL (ref 11.1–15.9)
IMMATURE GRANULOCYTES: 0 %
IRON: 78 ug/dL (ref 27–139)
Immature Grans (Abs): 0 10*3/uL (ref 0.0–0.1)
LDH: 168 IU/L (ref 119–226)
LDL Calculated: 95 mg/dL (ref 0–99)
LYMPHS ABS: 2.1 10*3/uL (ref 0.7–3.1)
Lymphs: 33 %
MCH: 30.2 pg (ref 26.6–33.0)
MCHC: 33.3 g/dL (ref 31.5–35.7)
MCV: 91 fL (ref 79–97)
MONOCYTES: 8 %
Monocytes Absolute: 0.5 10*3/uL (ref 0.1–0.9)
Neutrophils Absolute: 3.6 10*3/uL (ref 1.4–7.0)
Neutrophils: 56 %
PHOSPHORUS: 3.3 mg/dL (ref 2.5–4.5)
PLATELETS: 319 10*3/uL (ref 150–379)
Potassium: 4.2 mmol/L (ref 3.5–5.2)
RBC: 4.43 x10E6/uL (ref 3.77–5.28)
RDW: 13.6 % (ref 12.3–15.4)
SODIUM: 141 mmol/L (ref 134–144)
T3 UPTAKE RATIO: 19 % — AB (ref 24–39)
T4, Total: 12 ug/dL (ref 4.5–12.0)
TRIGLYCERIDES: 181 mg/dL — AB (ref 0–149)
TSH: 4.47 u[IU]/mL (ref 0.450–4.500)
Total Protein: 6.5 g/dL (ref 6.0–8.5)
Uric Acid: 4.8 mg/dL (ref 2.5–7.1)
VLDL Cholesterol Cal: 36 mg/dL (ref 5–40)
WBC: 6.4 10*3/uL (ref 3.4–10.8)

## 2016-06-17 LAB — HEPATITIS C ANTIBODY (REFLEX): HCV Ab: <0.1 s/co ratio (ref 0.0–0.9)

## 2016-06-17 LAB — B12 AND FOLATE PANEL: Folate: 8.3 ng/mL (ref >3.0)

## 2016-06-17 LAB — HCV COMMENT:

## 2016-06-17 LAB — VITAMIN D 25 HYDROXY (VIT D DEFICIENCY, FRACTURES): Vit D, 25-Hydroxy: 34.2 ng/mL (ref 30.0–100.0)

## 2016-07-13 ENCOUNTER — Ambulatory Visit: Payer: Self-pay | Admitting: Physician Assistant

## 2016-07-13 ENCOUNTER — Other Ambulatory Visit: Payer: Self-pay | Admitting: Emergency Medicine

## 2016-07-13 DIAGNOSIS — E538 Deficiency of other specified B group vitamins: Secondary | ICD-10-CM

## 2016-07-13 MED ORDER — PRAVASTATIN SODIUM 20 MG PO TABS: 20.0000 mg | ORAL_TABLET | Freq: Every day | ORAL | 3 refills | Status: DC

## 2016-07-13 MED ORDER — LEVOTHYROXINE SODIUM 88 MCG PO TABS: 88.0000 ug | ORAL_TABLET | Freq: Every day | ORAL | 6 refills | Status: DC

## 2016-07-13 MED ORDER — FLUTICASONE PROPIONATE 50 MCG/ACT NA SUSP: 2.0000 | Freq: Every day | NASAL | 12 refills | Status: DC

## 2016-07-13 MED ORDER — CYANOCOBALAMIN 1000 MCG/ML IJ SOLN
1000.0000 ug | Freq: Once | INTRAMUSCULAR | Status: AC
  Administered 2016-07-13: 1000 ug via INTRAMUSCULAR

## 2016-07-13 NOTE — Progress Notes (Signed)
Patient came in to have her scheduled Vitamin B12 injection. 

## 2016-07-13 NOTE — Telephone Encounter (Signed)
Med refill approved, pt recently seen in clinic and labs were performed

## 2016-07-21 ENCOUNTER — Other Ambulatory Visit: Payer: Self-pay | Admitting: Physician Assistant

## 2016-07-21 NOTE — Progress Notes (Signed)
Patient ID: Ashley Hampton, female   DOB: Feb 10, 1956, 60 y.o.   MRN: [id number] ANNUAL PREVENTATIVE CARE GYN  ENCOUNTER NOTE  Subjective:       Ashley Hampton is a 60 y.o. G22P1001 female here for a routine annual gynecologic exam.  Current complaints: 1. Pelvic pain- comes and goes 2. Breast Cancer, on Tamoxifen; was to have D&C last year for thickened endometrium on U/S; never followed through due to "life events"; no vaginal bleeding or discharge.   Gynecologic History No LMP recorded. Patient is postmenopausal. Contraception: post menopausal status Last Pap:07/2015 neg/neg Results were: wnl Last mammogram: 2017- thru [facility]. Results were: normal  Obstetric History OB History  Gravida Para Term Preterm AB Living  1 1 1     1   SAB TAB Ectopic Multiple Live Births          1    # Outcome Date GA Lbr Len/2nd Weight Sex Delivery Anes PTL Lv  1 Term      Vag-Spont   LIV      Past Medical History:  Diagnosis Date  . Anemia    pernicious  . AR (allergic rhinitis)   . Breast cancer ([place])    h/o- infiltrating ductal carcinoma  . DJD (degenerative joint disease)   . DJD (degenerative joint disease)   . Hypercholesteremia   . Hypertension    pt reports LOW BP  . Hypoglycemia   . Hypothyroid   . Menopausal state   . Multinodular goiter   . Osteopenia   . Osteoporosis   . PONV (postoperative nausea and vomiting)    after lumpectomy.  Has been pre-medicated since then  . Severe dysplasia of cervix (CIN III)   . SUI (stress urinary incontinence, female) 05/20/2015  . Urinary incontinence   . Vaginal atrophy   . Vitamin D deficiency     Past Surgical History:  Procedure Laterality Date  . BREAST LUMPECTOMY    . CERVICAL BIOPSY  W/ LOOP ELECTRODE EXCISION    . CERVICAL CONE BIOPSY    . CESAREAN SECTION    . ETHMOIDECTOMY Bilateral 09/02/2015   Procedure:  TOTAL ETHMOIDECTOMY;  Surgeon: Margaretha Sheffield, MD;  Location: [facility];  Service: ENT;  Laterality: Bilateral;  .  IMAGE GUIDED SINUS SURGERY N/A 09/02/2015   Procedure: IMAGE GUIDED SINUS SURGERY;  Surgeon: Margaretha Sheffield, MD;  Location: [facility];  Service: ENT;  Laterality: N/A;  gave disk to cece   . MASTECTOMY Right 2010   total - infiltrating ductal carcinoma  . MAXILLARY ANTROSTOMY Bilateral 09/02/2015   Procedure: ENDOSCOPIC MAXILLARY ANTROSTOMY ;  Surgeon: Margaretha Sheffield, MD;  Location: [facility];  Service: ENT;  Laterality: Bilateral;  . SEPTOPLASTY N/A 09/02/2015   Procedure: SEPTOPLASTY;  Surgeon: Margaretha Sheffield, MD;  Location: [facility];  Service: ENT;  Laterality: N/A;    Current Outpatient Prescriptions on File Prior to Visit  Medication Sig Dispense Refill  . calcium elemental as carbonate (PX ANTACID MAXIMUM STRENGTH) 400 MG chewable tablet Chew by mouth.    . cefdinir (OMNICEF) 300 MG capsule Take 1 capsule (300 mg total) by mouth 2 (two) times daily. 20 capsule 0  . celecoxib (CELEBREX) 200 MG capsule Take by mouth. Reported on 09/02/2015    . cyanocobalamin (,VITAMIN B-12,) 1000 MCG/ML injection Inject 1 mL (1,000 mcg total) into the muscle every 30 (thirty) days. 1 mL 11  . docusate sodium (STOOL SOFTENER) 100 MG capsule Take by mouth.    Marland Kitchen  fluconazole (DIFLUCAN) 150 MG tablet Take one now and one in a week 2 tablet 0  . fluticasone (FLONASE) 50 MCG/ACT nasal spray Place 2 sprays into both nostrils daily. 16 g 12  . levothyroxine (SYNTHROID, LEVOTHROID) 88 MCG tablet Take 1 tablet (88 mcg total) by mouth daily before breakfast. 30 tablet 6  . magnesium oxide (MAG-OX) 400 MG tablet Take by mouth.    . nystatin-triamcinolone ointment (MYCOLOG) Reported on 09/02/2015  0  . polyethylene glycol powder (GLYCOLAX/MIRALAX) powder Take by mouth.    . pravastatin (PRAVACHOL) 20 MG tablet Take 1 tablet (20 mg total) by mouth daily. 90 tablet 3  . sulfamethoxazole-trimethoprim (BACTRIM DS,SEPTRA DS) 800-160 MG tablet Take 1 tablet by mouth 2 (two) times daily.    . tamoxifen  (NOLVADEX) 10 MG tablet Take by mouth.    Marland Kitchen VITAMIN D, CHOLECALCIFEROL, PO Take by mouth.     No current facility-administered medications on file prior to visit.     Allergies  Allergen Reactions  . Ampicillin Rash    Social History   Social History  . Marital status: Married    Spouse name: N/A  . Number of children: N/A  . Years of education: N/A   Occupational History  . Not on file.   Social History Main Topics  . Smoking status: Never Smoker  . Smokeless tobacco: Not on file  . Alcohol use Yes     Comment: weekly  . Drug use: No  . Sexual activity: Yes    Birth control/ protection: Post-menopausal   Other Topics Concern  . Not on file   Social History Narrative  . No narrative on file    Family History  Problem Relation Age of Onset  . Lung cancer Mother   . Lung cancer Father   . Ovarian cancer Neg Hx   . Breast cancer Neg Hx   . Cancer Neg Hx   . Diabetes Neg Hx   . Heart disease Neg Hx     The following portions of the patient's history were reviewed and updated as appropriate: allergies, current medications, past family history, past medical history, past social history, past surgical history and problem list.  Review of Systems ROS Review of Systems - General ROS: negative for - chills, fatigue, fever, hot flashes, night sweats, weight gain or weight loss Psychological ROS: negative for - anxiety, decreased libido, depression, mood swings, physical abuse or sexual abuse Ophthalmic ROS: negative for - blurry vision, eye pain or loss of vision ENT ROS: negative for - headaches, hearing change, visual changes or vocal changes Allergy and Immunology ROS: negative for - hives, itchy/watery eyes or seasonal allergies Hematological and Lymphatic ROS: negative for - bleeding problems, bruising, swollen lymph nodes or weight loss Endocrine ROS: negative for - galactorrhea, hair pattern changes, hot flashes, malaise/lethargy, mood swings, palpitations,  polydipsia/polyuria, skin changes, temperature intolerance or unexpected weight changes Breast ROS: negative for - new or changing breast lumps or nipple discharge Respiratory ROS: negative for - cough or shortness of breath Cardiovascular ROS: negative for - chest pain, irregular heartbeat, palpitations or shortness of breath Gastrointestinal ROS: no abdominal pain, change in bowel habits, or black or bloody stools Genito-Urinary ROS: no dysuria, trouble voiding, or hematuria Musculoskeletal ROS: negative for - joint pain or joint stiffness Neurological ROS: negative for - bowel and bladder control changes Dermatological ROS: negative for rash and skin lesion changes   Objective:   BP (!) 82/54   Pulse 81  Ht 5\' 6"  (1.676 m)   Wt 144 lb 8 oz (65.5 kg)   BMI 23.32 kg/m  CONSTITUTIONAL: Well-developed, well-nourished female in no acute distress.  PSYCHIATRIC: Normal mood and affect. Normal behavior. Normal judgment and thought content. [place]: Alert and oriented to person, place, and time. Normal muscle tone coordination. No cranial nerve deficit noted. HENT:  Normocephalic, atraumatic, External right and left ear normal. Oropharynx is clear and moist EYES: Conjunctivae and EOM are normal. Pupils are equal, round, and reactive to light. No scleral icterus.  NECK: Normal range of motion, supple, no masses.  Normal thyroid.  SKIN: Skin is warm and dry. No rash noted. Not diaphoretic. No erythema. No pallor. CARDIOVASCULAR: Normal heart rate noted, regular rhythm, no murmur. RESPIRATORY: Clear to auscultation bilaterally. Effort and breath sounds normal, no problems with respiration noted. BREASTS: Right breast reconstruction changes; Nontender, no masses. No masses, skin changes, nipple drainage, or lymphadenopathy. ABDOMEN: Soft, normal bowel sounds, no distention noted.  No tenderness, rebound or guarding.  BLADDER: Normal PELVIC:  External Genitalia: Normal  BUS: Normal  Vagina:  Moderate atrophy  Cervix: Scarred; no cervical motion tenderness ; friable after Pap smear  Uterus: Normal; Small, mobile, nontender , anteior  Adnexa: Normal; Nonpalpable, nontender  RV: External Exam NormaI  MUSCULOSKELETAL: Normal range of motion. No tenderness.  No cyanosis, clubbing, or edema.  2+ distal pulses. LYMPHATIC: No Axillary, Supraclavicular, or Inguinal Adenopathy.    Assessment/Plan   Annual gynecologic examination 60 y.o. Contraception: post menopausal status bmi-23 Vaginal atrophy History of breast cancer; on tamoxifen; history of thickened endometrium on ultrasound ; needs D&C History of CIN-3, status post excision;;Last year Pap smear n/n  Pap:due 2019;  reflex Pap done today Mammogram: thru [facility] Stool Guaiac Testing:  utd Labs: thru pcp Routine preventative health maintenance measures emphasized: Exercise/Diet/Weight control, Tobacco Warnings and Alcohol/Substance use risks Lubricants when necessary  hystersop /D&c scheduled  return for pre op appoinmnt Return to [place], CMA  Brayton Mars, MD  Note: This dictation was prepared with Dragon dictation along with smaller phrase technology. Any transcriptional errors that result from this process are unintentional.   Note: This dictation was prepared with Dragon dictation along with smaller phrase technology. Any transcriptional errors that result from this process are unintentional.

## 2016-07-25 ENCOUNTER — Ambulatory Visit (INDEPENDENT_AMBULATORY_CARE_PROVIDER_SITE_OTHER): Payer: Managed Care, Other (non HMO) | Admitting: Obstetrics and Gynecology

## 2016-07-25 ENCOUNTER — Encounter: Payer: Self-pay | Admitting: Obstetrics and Gynecology

## 2016-07-25 VITALS — BP 82/54 | HR 81 | Ht 66.0 in | Wt 144.5 lb

## 2016-07-25 DIAGNOSIS — Z780 Asymptomatic menopausal state: Secondary | ICD-10-CM

## 2016-07-25 DIAGNOSIS — N952 Postmenopausal atrophic vaginitis: Secondary | ICD-10-CM | POA: Diagnosis not present

## 2016-07-25 DIAGNOSIS — M8580 Other specified disorders of bone density and structure, unspecified site: Secondary | ICD-10-CM

## 2016-07-25 DIAGNOSIS — Z853 Personal history of malignant neoplasm of breast: Secondary | ICD-10-CM

## 2016-07-25 DIAGNOSIS — R9389 Abnormal findings on diagnostic imaging of other specified body structures: Secondary | ICD-10-CM

## 2016-07-25 DIAGNOSIS — Z01419 Encounter for gynecological examination (general) (routine) without abnormal findings: Secondary | ICD-10-CM

## 2016-07-25 DIAGNOSIS — Z9889 Other specified postprocedural states: Secondary | ICD-10-CM | POA: Insufficient documentation

## 2016-07-25 DIAGNOSIS — Z87410 Personal history of cervical dysplasia: Secondary | ICD-10-CM | POA: Diagnosis not present

## 2016-07-25 DIAGNOSIS — R938 Abnormal findings on diagnostic imaging of other specified body structures: Secondary | ICD-10-CM | POA: Diagnosis not present

## 2016-07-25 NOTE — Addendum Note (Signed)
Addended by: Elouise Munroe on: 07/25/2016 09:02 AM   Modules accepted: Orders

## 2016-07-25 NOTE — Patient Instructions (Addendum)
1. No Pap smear 2. Mammogram follow-up through [facility] 3. Stool guaiac testing ordered 4. Screening labs through her primary care 5. Continue with healthy eating and exercise 6. Continue with calcium and vitamin D supplementation 7. Return in 1 year   Health Maintenance for Postmenopausal Women Introduction Menopause is a normal process in which your reproductive ability comes to an end. This process happens gradually over a span of months to years, usually between the ages of 25 and 62. Menopause is complete when you have missed 12 consecutive menstrual periods. It is important to talk with your health care provider about some of the most common conditions that affect postmenopausal women, such as heart disease, cancer, and bone loss (osteoporosis). Adopting a healthy lifestyle and getting preventive care can help to promote your health and wellness. Those actions can also lower your chances of developing some of these common conditions. What should I know about menopause? During menopause, you may experience a number of symptoms, such as:  Moderate-to-severe hot flashes.  Night sweats.  Decrease in sex drive.  Mood swings.  Headaches.  Tiredness.  Irritability.  Memory problems.  Insomnia. Choosing to treat or not to treat menopausal changes is an individual decision that you make with your health care provider. What should I know about hormone replacement therapy and supplements? Hormone therapy products are effective for treating symptoms that are associated with menopause, such as hot flashes and night sweats. Hormone replacement carries certain risks, especially as you become older. If you are thinking about using estrogen or estrogen with progestin treatments, discuss the benefits and risks with your health care provider. What should I know about heart disease and stroke? Heart disease, heart attack, and stroke become more likely as you age. This may be due, in part, to the  hormonal changes that your body experiences during menopause. These can affect how your body processes dietary fats, triglycerides, and cholesterol. Heart attack and stroke are both medical emergencies. There are many things that you can do to help prevent heart disease and stroke:  Have your blood pressure checked at least every 1-2 years. High blood pressure causes heart disease and increases the risk of stroke.  If you are 29-74 years old, ask your health care provider if you should take aspirin to prevent a heart attack or a stroke.  Do not use any tobacco products, including cigarettes, chewing tobacco, or electronic cigarettes. If you need help quitting, ask your health care provider.  It is important to eat a healthy diet and maintain a healthy weight.  Be sure to include plenty of vegetables, fruits, low-fat dairy products, and lean protein.  Avoid eating foods that are high in solid fats, added sugars, or salt (sodium).  Get regular exercise. This is one of the most important things that you can do for your health.  Try to exercise for at least 150 minutes each week. The type of exercise that you do should increase your heart rate and make you sweat. This is known as moderate-intensity exercise.  Try to do strengthening exercises at least twice each week. Do these in addition to the moderate-intensity exercise.  Know your numbers.Ask your health care provider to check your cholesterol and your blood glucose. Continue to have your blood tested as directed by your health care provider. What should I know about cancer screening? There are several types of cancer. Take the following steps to reduce your risk and to catch any cancer development as early as possible. Breast  Cancer  Practice breast self-awareness.  This means understanding how your breasts normally appear and feel.  It also means doing regular breast self-exams. Let your health care provider know about any changes,  no matter how small.  If you are 82 or older, have a clinician do a breast exam (clinical breast exam or CBE) every year. Depending on your age, family history, and medical history, it may be recommended that you also have a yearly breast X-ray (mammogram).  If you have a family history of breast cancer, talk with your health care provider about genetic screening.  If you are at high risk for breast cancer, talk with your health care provider about having an MRI and a mammogram every year.  Breast cancer (BRCA) gene test is recommended for women who have family members with BRCA-related cancers. Results of the assessment will determine the need for genetic counseling and BRCA1 and for BRCA2 testing. BRCA-related cancers include these types:  Breast. This occurs in males or females.  Ovarian.  Tubal. This may also be called fallopian tube cancer.  Cancer of the abdominal or pelvic lining (peritoneal cancer).  Prostate.  Pancreatic. Cervical, Uterine, and Ovarian Cancer  Your health care provider may recommend that you be screened regularly for cancer of the pelvic organs. These include your ovaries, uterus, and vagina. This screening involves a pelvic exam, which includes checking for microscopic changes to the surface of your cervix (Pap test).  For women ages 21-65, health care providers may recommend a pelvic exam and a Pap test every three years. For women ages 17-65, they may recommend the Pap test and pelvic exam, combined with testing for human papilloma virus (HPV), every five years. Some types of HPV increase your risk of cervical cancer. Testing for HPV may also be done on women of any age who have unclear Pap test results.  Other health care providers may not recommend any screening for nonpregnant women who are considered low risk for pelvic cancer and have no symptoms. Ask your health care provider if a screening pelvic exam is right for you.  If you have had past treatment  for cervical cancer or a condition that could lead to cancer, you need Pap tests and screening for cancer for at least 20 years after your treatment. If Pap tests have been discontinued for you, your risk factors (such as having a new sexual partner) need to be reassessed to determine if you should start having screenings again. Some women have medical problems that increase the chance of getting cervical cancer. In these cases, your health care provider may recommend that you have screening and Pap tests more often.  If you have a family history of uterine cancer or ovarian cancer, talk with your health care provider about genetic screening.  If you have vaginal bleeding after reaching menopause, tell your health care provider.  There are currently no reliable tests available to screen for ovarian cancer. Lung Cancer  Lung cancer screening is recommended for adults 52-12 years old who are at high risk for lung cancer because of a history of smoking. A yearly low-dose CT scan of the lungs is recommended if you:  Currently smoke.  Have a history of at least 30 pack-years of smoking and you currently smoke or have quit within the past 15 years. A pack-year is smoking an average of one pack of cigarettes per day for one year. Yearly screening should:  Continue until it has been 15 years since you quit.  Stop if you develop a health problem that would prevent you from having lung cancer treatment. Colorectal Cancer  This type of cancer can be detected and can often be prevented.  Routine colorectal cancer screening usually begins at age 40 and continues through age 7.  If you have risk factors for colon cancer, your health care provider may recommend that you be screened at an earlier age.  If you have a family history of colorectal cancer, talk with your health care provider about genetic screening.  Your health care provider may also recommend using home test kits to check for hidden blood  in your stool.  A small camera at the end of a tube can be used to examine your colon directly (sigmoidoscopy or colonoscopy). This is done to check for the earliest forms of colorectal cancer.  Direct examination of the colon should be repeated every 5-10 years until age 100. However, if early forms of precancerous polyps or small growths are found or if you have a family history or genetic risk for colorectal cancer, you may need to be screened more often. Skin Cancer  Check your skin from head to toe regularly.  Monitor any moles. Be sure to tell your health care provider:  About any new moles or changes in moles, especially if there is a change in a mole's shape or color.  If you have a mole that is larger than the size of a pencil eraser.  If any of your family members has a history of skin cancer, especially at a young age, talk with your health care provider about genetic screening.  Always use sunscreen. Apply sunscreen liberally and repeatedly throughout the day.  Whenever you are outside, protect yourself by wearing long sleeves, pants, a wide-brimmed hat, and sunglasses. What should I know about osteoporosis? Osteoporosis is a condition in which bone destruction happens more quickly than new bone creation. After menopause, you may be at an increased risk for osteoporosis. To help prevent osteoporosis or the bone fractures that can happen because of osteoporosis, the following is recommended:  If you are 31-46 years old, get at least 1,000 mg of calcium and at least 600 mg of vitamin D per day.  If you are older than age 14 but younger than age 75, get at least 1,200 mg of calcium and at least 600 mg of vitamin D per day.  If you are older than age 42, get at least 1,200 mg of calcium and at least 800 mg of vitamin D per day. Smoking and excessive alcohol intake increase the risk of osteoporosis. Eat foods that are rich in calcium and vitamin D, and do weight-bearing exercises  several times each week as directed by your health care provider. What should I know about how menopause affects my mental health? Depression may occur at any age, but it is more common as you become older. Common symptoms of depression include:  Low or sad mood.  Changes in sleep patterns.  Changes in appetite or eating patterns.  Feeling an overall lack of motivation or enjoyment of activities that you previously enjoyed.  Frequent crying spells. Talk with your health care provider if you think that you are experiencing depression. What should I know about immunizations? It is important that you get and maintain your immunizations. These include:  Tetanus, diphtheria, and pertussis (Tdap) booster vaccine.  Influenza every year before the flu season begins.  Pneumonia vaccine.  Shingles vaccine. Your health care provider may also recommend  other immunizations. This information is not intended to replace advice given to you by your health care provider. Make sure you discuss any questions you have with your health care provider. Document Released: 09/15/2005 Document Revised: 02/11/2016 Document Reviewed: 04/27/2015  2017 Elsevier     Genital Warts Introduction Genital warts are a common STD (sexually transmitted disease). They may appear as small bumps on the tissues of the genital area or anal area. Sometimes, they can become irritated and cause pain. Genital warts are easily passed to other people through sexual contact. Getting treatment is important because genital warts can lead to other problems. In females, the virus that causes genital warts may increase the risk of cervical cancer. What are the causes? Genital warts are caused by a virus that is called human papillomavirus (HPV). HPV is spread by having unprotected sex with an infected person. It can be spread through vaginal, anal, and oral sex. Many people do not know that they are infected. They may be infected for  years without problems. However, even if they do not have problems, they can pass the infection to their sexual partners. What increases the risk? Genital warts are more likely to develop in:  People who have unprotected sex.  People who have multiple sexual partners.  People who become sexually active before they are 62 years of age.  Men who are not circumcised.  Women who have a female sexual partner who is not circumcised.  People who have a weakened body defense system (immune system) due to disease or medicine.  People who smoke. What are the signs or symptoms? Symptoms of genital warts include:  Small growths in the genital area or anal area. These warts often grow in clusters.  Itching and irritation in the genital area or anal area.  Bleeding from the warts.  Painful sexual intercourse. How is this diagnosed? Genital warts can usually be diagnosed from their appearance on the vagina, vulva, penis, perineum, anus, or rectum. Tests may also be done, such as:  Biopsy. A tissue sample is removed so it can be looked at under a microscope.  Colposcopy. In females, a magnifying tool is used to examine the vagina and cervix. Certain solutions may be used to make the HPV cells change color so they can be seen more easily.  A Pap test in females.  Tests for other STDs. How is this treated? Treatment for genital warts may include:  Applying prescription medicines to the warts. These may be solutions or creams.  Freezing the warts with liquid nitrogen (cryotherapy).  Burning the warts with:  Laser treatment.  An electrified probe (electrocautery).  Injecting a substance (Candida antigen or Trichophyton antigen) into the warts to help the body's immune system to fight off the warts.  Interferon injections.  Surgery to remove the warts. Follow these instructions at home: Medicines  Apply over-the-counter and prescription medicines only as told by your health care  provider.  Do not treat genital warts with medicines that are used for treating hand warts.  Talk with your health care provider about using over-the-counter anti-itch creams. General instructions  Do not touch or scratch the warts.  Do not have sex until your treatment has been completed.  Tell your current and past sexual partners about your condition because they may also need treatment.  Keep all follow-up visits as told by your health care provider. This is important.  After treatment, use condoms during sex to prevent future infections. Other Instructions for Women  Women who have genital warts might need increased screening for cervical cancer. This type of cancer is slow growing and can be cured if it is found early. Chances of developing cervical cancer are increased with HPV.  If you become pregnant, tell your health care provider that you have had HPV. Your health care provider will monitor you closely during pregnancy to be sure that your baby is safe. How is this prevented? Talk with your health care provider about getting the HPV vaccines. These vaccines prevent some HPV infections and cancers. It is recommended that the vaccine be given to males and females who are 41-34 years of age. It will not work if you already have HPV, and it is not recommended for pregnant women. Contact a health care provider if:  You have redness, swelling, or pain in the area of the treated skin.  You have a fever.  You feel generally ill.  You feel lumps in and around your genital area or anal area.  You have bleeding in your genital area or anal area.  You have pain during sexual intercourse. This information is not intended to replace advice given to you by your health care provider. Make sure you discuss any questions you have with your health care provider. Document Released: 07/21/2000 Document Revised: 12/30/2015 Document Reviewed: 10/19/2014  2017 Elsevier

## 2016-07-27 LAB — PAP IG W/ RFLX HPV ASCU: PAP Smear Comment: 0

## 2016-08-11 ENCOUNTER — Ambulatory Visit: Payer: Self-pay | Admitting: Physician Assistant

## 2016-08-11 DIAGNOSIS — E538 Deficiency of other specified B group vitamins: Secondary | ICD-10-CM

## 2016-08-11 MED ORDER — CYANOCOBALAMIN 1000 MCG/ML IJ SOLN
1000.0000 ug | Freq: Once | INTRAMUSCULAR | Status: AC
  Administered 2016-08-11: 1000 ug via INTRAMUSCULAR

## 2016-08-11 NOTE — Progress Notes (Signed)
Patient came in to have her scheduled b12 injection.

## 2016-08-19 ENCOUNTER — Other Ambulatory Visit: Payer: Self-pay | Admitting: Physician Assistant

## 2016-09-11 ENCOUNTER — Ambulatory Visit: Payer: Self-pay | Admitting: Physician Assistant

## 2016-09-11 DIAGNOSIS — E538 Deficiency of other specified B group vitamins: Secondary | ICD-10-CM

## 2016-09-11 MED ORDER — CYANOCOBALAMIN 1000 MCG/ML IJ SOLN
1000.0000 ug | Freq: Once | INTRAMUSCULAR | Status: AC
  Administered 2016-09-11: 1000 ug via INTRAMUSCULAR

## 2016-09-11 NOTE — Progress Notes (Signed)
Here for b12 injection given by rma

## 2016-09-22 ENCOUNTER — Other Ambulatory Visit: Payer: Self-pay | Admitting: Emergency Medicine

## 2016-09-22 MED ORDER — LEVOTHYROXINE SODIUM 75 MCG PO TABS: 75.0000 ug | ORAL_TABLET | Freq: Every day | ORAL | 3 refills | Status: DC

## 2016-09-22 NOTE — Telephone Encounter (Signed)
I have corrected the dosage on the patient.  Can you please resend the script to her mail order pharmacy? Thanks

## 2016-09-22 NOTE — Telephone Encounter (Signed)
Med refill approved/corrected for snythroid 45mcg

## 2016-10-13 ENCOUNTER — Ambulatory Visit: Payer: Self-pay | Admitting: Physician Assistant

## 2016-10-13 DIAGNOSIS — E538 Deficiency of other specified B group vitamins: Secondary | ICD-10-CM

## 2016-10-13 MED ORDER — CYANOCOBALAMIN 1000 MCG/ML IJ SOLN
1000.0000 ug | Freq: Once | INTRAMUSCULAR | Status: AC
  Administered 2016-10-13: 1000 ug via INTRAMUSCULAR

## 2016-10-13 NOTE — Progress Notes (Signed)
Pt here for nurse visit to get b12 injection

## 2016-11-14 ENCOUNTER — Ambulatory Visit: Payer: Self-pay | Admitting: Physician Assistant

## 2016-11-14 DIAGNOSIS — E538 Deficiency of other specified B group vitamins: Secondary | ICD-10-CM

## 2016-11-14 MED ORDER — CYANOCOBALAMIN 1000 MCG/ML IJ SOLN
1000.0000 ug | Freq: Once | INTRAMUSCULAR | Status: AC
  Administered 2016-11-14: 1000 ug via INTRAMUSCULAR

## 2016-11-14 NOTE — Progress Notes (Signed)
Patient came in to have her monthly vitamin b12 injection.

## 2016-11-21 DIAGNOSIS — Z5181 Encounter for therapeutic drug level monitoring: Secondary | ICD-10-CM | POA: Insufficient documentation

## 2016-11-21 DIAGNOSIS — Z79810 Long term (current) use of selective estrogen receptor modulators (SERMs): Secondary | ICD-10-CM

## 2016-11-23 DIAGNOSIS — Z8739 Personal history of other diseases of the musculoskeletal system and connective tissue: Secondary | ICD-10-CM | POA: Insufficient documentation

## 2016-12-14 ENCOUNTER — Ambulatory Visit: Payer: Self-pay

## 2016-12-14 DIAGNOSIS — E538 Deficiency of other specified B group vitamins: Secondary | ICD-10-CM

## 2016-12-14 MED ORDER — CYANOCOBALAMIN 1000 MCG/ML IJ SOLN
1000.0000 ug | Freq: Once | INTRAMUSCULAR | Status: AC
  Administered 2016-12-14: 1000 ug via INTRAMUSCULAR

## 2016-12-14 NOTE — Progress Notes (Unsigned)
Patient came in to get her scheduled B12 injection.

## 2017-01-15 ENCOUNTER — Encounter: Payer: Self-pay | Admitting: Physician Assistant

## 2017-01-15 ENCOUNTER — Ambulatory Visit: Payer: Self-pay | Admitting: Physician Assistant

## 2017-01-15 VITALS — BP 110/70 | Temp 98.5°F | Resp 16

## 2017-01-15 DIAGNOSIS — E538 Deficiency of other specified B group vitamins: Secondary | ICD-10-CM

## 2017-01-15 DIAGNOSIS — L259 Unspecified contact dermatitis, unspecified cause: Secondary | ICD-10-CM

## 2017-01-15 MED ORDER — HYDROXYZINE HCL 10 MG PO TABS: 10.0000 mg | ORAL_TABLET | Freq: Three times a day (TID) | ORAL | 6 refills | Status: AC | PRN

## 2017-01-15 MED ORDER — DEXAMETHASONE SODIUM PHOSPHATE 10 MG/ML IJ SOLN
10.0000 mg | Freq: Once | INTRAMUSCULAR | Status: AC
  Administered 2017-01-15: 10 mg via INTRAMUSCULAR

## 2017-01-15 MED ORDER — TRIAMCINOLONE ACETONIDE 0.1 % EX CREA: 1.0000 "application " | TOPICAL_CREAM | Freq: Two times a day (BID) | CUTANEOUS | 3 refills | Status: DC

## 2017-01-15 MED ORDER — CYANOCOBALAMIN 1000 MCG/ML IJ SOLN
1000.0000 ug | Freq: Once | INTRAMUSCULAR | Status: AC
  Administered 2017-01-15: 1000 ug via INTRAMUSCULAR

## 2017-01-15 NOTE — Progress Notes (Signed)
S: c/o itchy rash on hands, arms, legs, was outside in yard and then broke out, sx for few days, tried multiple otc meds without relief, denies fever/chills, also ?if she could get a stronger cream for the itching, and a refill on atarax that her doctor had given for her  O: vitals wnl, nad, lungs c t a, cv rrr, skin with small raised red areas some with streaks/blisters, no drainage, n/v intact  A: acute contact dermatitis, b12 def, itching  P: decadron, b12 inj, atarax, triamcinolone cream

## 2017-02-15 ENCOUNTER — Ambulatory Visit: Payer: Self-pay | Admitting: Physician Assistant

## 2017-02-15 DIAGNOSIS — E538 Deficiency of other specified B group vitamins: Secondary | ICD-10-CM

## 2017-02-15 MED ORDER — CYANOCOBALAMIN 1000 MCG/ML IJ SOLN
1000.0000 ug | Freq: Once | INTRAMUSCULAR | Status: AC
  Administered 2017-02-15: 1000 ug via INTRAMUSCULAR

## 2017-02-15 NOTE — Progress Notes (Signed)
Patient came in to get her scheduled b12 injection. 

## 2017-03-19 ENCOUNTER — Ambulatory Visit: Payer: Self-pay | Admitting: Physician Assistant

## 2017-03-19 DIAGNOSIS — E538 Deficiency of other specified B group vitamins: Secondary | ICD-10-CM

## 2017-03-19 MED ORDER — DEXAMETHASONE SODIUM PHOSPHATE 10 MG/ML IJ SOLN: 10.0000 mg | Freq: Once | INTRAMUSCULAR | Status: DC

## 2017-03-22 MED ORDER — CYANOCOBALAMIN 1000 MCG/ML IJ SOLN
1000.0000 ug | Freq: Once | INTRAMUSCULAR | Status: AC
  Administered 2017-03-19: 1000 ug via INTRAMUSCULAR

## 2017-03-22 NOTE — Addendum Note (Signed)
Addended by: Rudene Anda T on: 03/22/2017 09:21 AM   Modules accepted: Orders, Level of Service

## 2017-04-06 ENCOUNTER — Ambulatory Visit: Payer: Self-pay | Admitting: Physician Assistant

## 2017-04-06 ENCOUNTER — Encounter: Payer: Self-pay | Admitting: Physician Assistant

## 2017-04-06 VITALS — BP 100/57 | HR 88 | Temp 97.9°F | Resp 16

## 2017-04-06 DIAGNOSIS — R002 Palpitations: Secondary | ICD-10-CM

## 2017-04-06 DIAGNOSIS — D510 Vitamin B12 deficiency anemia due to intrinsic factor deficiency: Secondary | ICD-10-CM

## 2017-04-06 NOTE — Progress Notes (Signed)
S: c/o palpitations, notices them more at night when she lies down, will feel a fluttering, getting more freq, no cp/sob when she has an episode, hx of hypothyroidism, b12 def, breast cancer with chemotherapy, 10y remission; states she had the really harsh chemo, is taking sudafed for pnd, if needs to see cardiology would like to go to [place] where her husband goes  O: vitals wnl, nad, lungs c t a, cv rrr, ekg nsr  A: palpitations  P: labs today, stop sudafed, will refer to cardiology due to palptiations and hx of chemotherapy

## 2017-04-07 LAB — CMP12+LP+TP+TSH+6AC+CBC/D/PLT
ALT: 14 IU/L (ref 0–32)
AST: 19 IU/L (ref 0–40)
Albumin/Globulin Ratio: 1.6 (ref 1.2–2.2)
Albumin: 4.1 g/dL (ref 3.6–4.8)
Alkaline Phosphatase: 63 IU/L (ref 39–117)
BUN/Creatinine Ratio: 15 (ref 12–28)
BUN: 15 mg/dL (ref 8–27)
Basophils Absolute: 0.1 10*3/uL (ref 0.0–0.2)
Basos: 1 %
Bilirubin Total: <0.2 mg/dL (ref 0.0–1.2)
CHOL/HDL RATIO: 2.6 ratio (ref 0.0–4.4)
CREATININE: 0.97 mg/dL (ref 0.57–1.00)
Calcium: 9.5 mg/dL (ref 8.7–10.3)
Chloride: 101 mmol/L (ref 96–106)
Cholesterol, Total: 174 mg/dL (ref 100–199)
EOS (ABSOLUTE): 0.2 10*3/uL (ref 0.0–0.4)
Eos: 3 %
Estimated CHD Risk: <0.5 times avg. (ref 0.0–1.0)
Free Thyroxine Index: 1.9 (ref 1.2–4.9)
GFR, EST AFRICAN AMERICAN: 72 mL/min/{1.73_m2} (ref >59)
GFR, EST NON AFRICAN AMERICAN: 63 mL/min/{1.73_m2} (ref >59)
GGT: 11 IU/L (ref 0–60)
Globulin, Total: 2.5 g/dL (ref 1.5–4.5)
Glucose: 62 mg/dL — ABNORMAL LOW (ref 65–99)
HDL: 67 mg/dL (ref >39)
Hematocrit: 39.5 % (ref 34.0–46.6)
Hemoglobin: 12.8 g/dL (ref 11.1–15.9)
Immature Grans (Abs): 0 10*3/uL (ref 0.0–0.1)
Immature Granulocytes: 0 %
Iron: 88 ug/dL (ref 27–139)
LDH: 209 IU/L (ref 119–226)
LDL Calculated: 84 mg/dL (ref 0–99)
Lymphocytes Absolute: 2.2 10*3/uL (ref 0.7–3.1)
Lymphs: 31 %
MCH: 29.3 pg (ref 26.6–33.0)
MCHC: 32.4 g/dL (ref 31.5–35.7)
MCV: 90 fL (ref 79–97)
MONOCYTES: 8 %
Monocytes Absolute: 0.6 10*3/uL (ref 0.1–0.9)
NEUTROS ABS: 4.2 10*3/uL (ref 1.4–7.0)
Neutrophils: 57 %
PHOSPHORUS: 3.3 mg/dL (ref 2.5–4.5)
POTASSIUM: 4.2 mmol/L (ref 3.5–5.2)
Platelets: 339 10*3/uL (ref 150–379)
RBC: 4.37 x10E6/uL (ref 3.77–5.28)
RDW: 14 % (ref 12.3–15.4)
Sodium: 142 mmol/L (ref 134–144)
T3 Uptake Ratio: 16 % — ABNORMAL LOW (ref 24–39)
T4 TOTAL: 11.6 ug/dL (ref 4.5–12.0)
TSH: 4.79 u[IU]/mL — AB (ref 0.450–4.500)
Total Protein: 6.6 g/dL (ref 6.0–8.5)
Triglycerides: 117 mg/dL (ref 0–149)
URIC ACID: 5.2 mg/dL (ref 2.5–7.1)
VLDL Cholesterol Cal: 23 mg/dL (ref 5–40)
WBC: 7.2 10*3/uL (ref 3.4–10.8)

## 2017-04-07 LAB — B12 AND FOLATE PANEL
Folate: 10.4 ng/mL (ref >3.0)
Vitamin B-12: 980 pg/mL (ref 232–1245)

## 2017-04-10 MED ORDER — LEVOTHYROXINE SODIUM 100 MCG PO TABS: 100.0000 ug | ORAL_TABLET | Freq: Every day | ORAL | 3 refills | Status: DC

## 2017-04-10 NOTE — Addendum Note (Signed)
Addended by: Versie Starks on: 04/10/2017 03:29 PM   Modules accepted: Orders

## 2017-04-16 ENCOUNTER — Telehealth: Payer: Self-pay | Admitting: Emergency Medicine

## 2017-04-16 DIAGNOSIS — R002 Palpitations: Secondary | ICD-10-CM

## 2017-04-16 NOTE — Telephone Encounter (Signed)
Patient called and expressed that she continues to have the heart palpitations and wants to do the holter monitor. Patient also expressed that she will be on vacation all week and would be available to pick up the monitor next week.

## 2017-04-17 NOTE — Telephone Encounter (Signed)
Pt still having palpitations after stopping sudafed, ordered 48h holter monitor, refer to cardiology

## 2017-04-18 ENCOUNTER — Ambulatory Visit: Payer: Self-pay | Admitting: Physician Assistant

## 2017-04-18 DIAGNOSIS — E538 Deficiency of other specified B group vitamins: Secondary | ICD-10-CM

## 2017-04-18 MED ORDER — CYANOCOBALAMIN 1000 MCG/ML IJ SOLN
1000.0000 ug | Freq: Once | INTRAMUSCULAR | Status: AC
  Administered 2017-04-18: 1000 ug via INTRAMUSCULAR

## 2017-04-18 NOTE — Progress Notes (Signed)
Patient came in to have her scheduled Vitamin B12 injection per Susan's authorization.

## 2017-04-26 ENCOUNTER — Ambulatory Visit
Admission: RE | Admit: 2017-04-26 | Discharge: 2017-04-26 | Disposition: A | Discharge status: Home / Self Care | Payer: Managed Care, Other (non HMO) | Source: Ambulatory Visit | Attending: Physician Assistant | Admitting: Physician Assistant

## 2017-04-26 DIAGNOSIS — R002 Palpitations: Secondary | ICD-10-CM | POA: Insufficient documentation

## 2017-05-03 NOTE — CV Procedure (Signed)
Outpatient Holter monitor Date of 04/26/2017 Indication palpitation Ashok Cordia PA Patient will hold 48 hours  Total beats 241,737 Millimeters 1 maximum 153 average 85  rare PACs  frequent PVCs No runs No pauses No high-grade block No ST segment changes No diary submitted . Conclusion Benign Holter No clear indication for high-grade antiarrhythmic or permanent pacemaker

## 2017-05-14 ENCOUNTER — Other Ambulatory Visit: Payer: Self-pay | Admitting: Physician Assistant

## 2017-05-14 DIAGNOSIS — E538 Deficiency of other specified B group vitamins: Secondary | ICD-10-CM

## 2017-05-14 NOTE — Telephone Encounter (Signed)
Med refill for b12 approved

## 2017-05-16 ENCOUNTER — Encounter: Payer: Self-pay | Admitting: Obstetrics and Gynecology

## 2017-05-16 ENCOUNTER — Ambulatory Visit (INDEPENDENT_AMBULATORY_CARE_PROVIDER_SITE_OTHER): Payer: Managed Care, Other (non HMO) | Admitting: Obstetrics and Gynecology

## 2017-05-16 VITALS — BP 99/65 | HR 75 | Ht 66.0 in | Wt 145.0 lb

## 2017-05-16 DIAGNOSIS — N8111 Cystocele, midline: Secondary | ICD-10-CM

## 2017-05-16 DIAGNOSIS — R9389 Abnormal findings on diagnostic imaging of other specified body structures: Secondary | ICD-10-CM | POA: Diagnosis not present

## 2017-05-16 DIAGNOSIS — Z853 Personal history of malignant neoplasm of breast: Secondary | ICD-10-CM

## 2017-05-16 DIAGNOSIS — N952 Postmenopausal atrophic vaginitis: Secondary | ICD-10-CM | POA: Diagnosis not present

## 2017-05-16 DIAGNOSIS — R102 Pelvic and perineal pain: Secondary | ICD-10-CM | POA: Diagnosis not present

## 2017-05-16 DIAGNOSIS — N898 Other specified noninflammatory disorders of vagina: Secondary | ICD-10-CM | POA: Insufficient documentation

## 2017-05-16 DIAGNOSIS — N816 Rectocele: Secondary | ICD-10-CM | POA: Diagnosis not present

## 2017-05-16 NOTE — Progress Notes (Signed)
GYN ENCOUNTER NOTE  Subjective:       Ashley Hampton is a 61 y.o. G44P1001 female is here for gynecologic evaluation of the following issues:  1. Pelvic pain  2 week history of pelvic pain described as bilateral achiness graded out as 3/10 intensity; she has not been needing to take any medications for the pain. Symptoms began approximately 2 weeks ago. Patient reports having vaginal odor and a sense that the "vagina JUST feels different" . She does not have any history of vaginal discharge or bleeding. Currently she is in year 35 of 10 on tamoxifen for breast cancer. Patient was to have hysteroscopy/D&C this past year because of a thickened endometrium on ultrasound; she has not followed through with that recommendation.   Gynecologic History No LMP recorded. Patient is postmenopausal. Contraception: post menopausal status Last Pap:07/2015 neg/neg Results were: wnl Last mammogram: 2017- thru [facility]. Results were: normal  Obstetric History OB History  Gravida Para Term Preterm AB Living  1 1 1     1   SAB TAB Ectopic Multiple Live Births          1    # Outcome Date GA Lbr Len/2nd Weight Sex Delivery Anes PTL Lv  1 Term      Vag-Spont   LIV      Past Medical History:  Diagnosis Date  . Anemia    pernicious  . AR (allergic rhinitis)   . Breast cancer ([place])    h/o- infiltrating ductal carcinoma  . DJD (degenerative joint disease)   . DJD (degenerative joint disease)   . Hypercholesteremia   . Hypertension    pt reports LOW BP  . Hypoglycemia   . Hypothyroid   . Menopausal state   . Multinodular goiter   . Osteopenia   . Osteoporosis   . PONV (postoperative nausea and vomiting)    after lumpectomy.  Has been pre-medicated since then  . Seasonal allergies   . Severe dysplasia of cervix (CIN III)   . SUI (stress urinary incontinence, female) 05/20/2015  . Urinary incontinence   . Vaginal atrophy   . Vitamin D deficiency     Past Surgical History:  Procedure Laterality  Date  . BREAST LUMPECTOMY    . CERVICAL BIOPSY  W/ LOOP ELECTRODE EXCISION    . CERVICAL CONE BIOPSY    . CESAREAN SECTION    . ETHMOIDECTOMY Bilateral 09/02/2015   Procedure:  TOTAL ETHMOIDECTOMY;  Surgeon: Margaretha Sheffield, MD;  Location: [place];  Service: ENT;  Laterality: Bilateral;  . IMAGE GUIDED SINUS SURGERY N/A 09/02/2015   Procedure: IMAGE GUIDED SINUS SURGERY;  Surgeon: Margaretha Sheffield, MD;  Location: [facility];  Service: ENT;  Laterality: N/A;  gave disk to cece   . MASTECTOMY Right 2010   total - infiltrating ductal carcinoma  . MAXILLARY ANTROSTOMY Bilateral 09/02/2015   Procedure: ENDOSCOPIC MAXILLARY ANTROSTOMY ;  Surgeon: Margaretha Sheffield, MD;  Location: [place];  Service: ENT;  Laterality: Bilateral;  . SEPTOPLASTY N/A 09/02/2015   Procedure: SEPTOPLASTY;  Surgeon: Margaretha Sheffield, MD;  Location: [facility];  Service: ENT;  Laterality: N/A;    Current Outpatient Prescriptions on File Prior to Visit  Medication Sig Dispense Refill  . aspirin EC 81 MG tablet Take 81 mg by mouth daily.    . cyanocobalamin (,VITAMIN B-12,) 1000 MCG/ML injection INJECT 1 ML (1,000 MCG TOTAL) INTO THE MUSCLE EVERY 30 (THIRTY) DAYS. 1 mL 10  . docusate sodium (STOOL SOFTENER)  100 MG capsule Take by mouth.    . fluticasone (FLONASE) 50 MCG/ACT nasal spray Place 2 sprays into both nostrils daily. 16 g 12  . hydrOXYzine (ATARAX/VISTARIL) 10 MG tablet Take 1 tablet (10 mg total) by mouth 3 (three) times daily as needed. 30 tablet 6  . levothyroxine (SYNTHROID, LEVOTHROID) 100 MCG tablet Take 1 tablet (100 mcg total) by mouth daily. 90 tablet 3  . magnesium oxide (MAG-OX) 400 MG tablet Take by mouth.    . montelukast (SINGULAIR) 10 MG tablet Take by mouth.    . polyethylene glycol powder (GLYCOLAX/MIRALAX) powder Take by mouth.    . pravastatin (PRAVACHOL) 20 MG tablet Take 1 tablet (20 mg total) by mouth daily. 90 tablet 3  . tamoxifen (NOLVADEX) 10 MG tablet Take by  mouth.    Marland Kitchen VITAMIN D, CHOLECALCIFEROL, PO Take by mouth.     No current facility-administered medications on file prior to visit.     Allergies  Allergen Reactions  . Ampicillin Rash    Social History   Social History  . Marital status: Married    Spouse name: N/A  . Number of children: N/A  . Years of education: N/A   Occupational History  . Not on file.   Social History Main Topics  . Smoking status: Never Smoker  . Smokeless tobacco: Never Used  . Alcohol use Yes     Comment: occas  . Drug use: No  . Sexual activity: Yes    Birth control/ protection: Post-menopausal   Other Topics Concern  . Not on file   Social History Narrative  . No narrative on file    Family History  Problem Relation Age of Onset  . Lung cancer Mother   . Lung cancer Father   . Ovarian cancer Neg Hx   . Breast cancer Neg Hx   . Cancer Neg Hx   . Diabetes Neg Hx   . Heart disease Neg Hx     The following portions of the patient's history were reviewed and updated as appropriate: allergies, current medications, past family history, past medical history, past social history, past surgical history and problem list.  Review of Systems Review of Systems -Comprehensive review of systems Is negative except for that noted in history of present illness  Objective:   BP 99/65   Pulse 75   Ht 5\' 6"  (1.676 m)   Wt 145 lb (65.8 kg)   BMI 23.40 kg/m  CONSTITUTIONAL: Well-developed, well-nourished female in no acute distress.  HENT:  Normocephalic, atraumatic.  NECK:Not examined SKIN: Skin is warm and dry. No rash noted. Not diaphoretic. No erythema. No pallor. [place]: Alert and oriented to person, place, and time. PSYCHIATRIC: Normal mood and affect. Normal behavior. Normal judgment and thought content. CARDIOVASCULAR:Not Examined RESPIRATORY: Not Examined BREASTS: Not Examined ABDOMEN: Soft, non distended; Non tender.  No Organomegaly. PELVIC:  External Genitalia: Normal  BUS:  Normal  Vagina: Moderate vaginal atrophy; second-degree cystocele; mild rectocele  Cervix: Normal; no lesions; no cervical motion tenderness  Uterus: Normal size, shape,consistency, mobile, midplane, nontender  Adnexa: Normal; nonpalpable and nontender  RV: Normal external exam  Bladder: Nontender MUSCULOSKELETAL: Normal range of motion. No tenderness.  No cyanosis, clubbing, or edema.     Assessment:   1. Pelvic pain in female - US PELVIS (TRANSABDOMINAL ONLY); Future - US PELVIS TRANSVANGINAL NON-OB (TV ONLY); Future  2. History of breast cancer in female - US PELVIS (TRANSABDOMINAL ONLY); Future - US PELVIS TRANSVANGINAL NON-OB (TV  ONLY); Future  3. Vaginal odor  4. Cystocele, midline  5. Rectocele  6. Vaginal atrophy  7. History of thickened endometrium on ultrasound     Plan:   1. Pelvic ultrasound is ordered 2. Return in 1 week after ultrasound for further management planning 3. Patient will likely need hysteroscopy/D&C because of prior history of thickened endometrium on tamoxifen therapy  A total of 15 minutes were spent face-to-face with the patient during this encounter and over half of that time dealt with counseling and coordination of care.  Brayton Mars, MD  Note: This dictation was prepared with Dragon dictation along with smaller phrase technology. Any transcriptional errors that result from this process are unintentional.

## 2017-05-16 NOTE — Addendum Note (Signed)
Addended by: Elouise Munroe on: 05/16/2017 03:53 PM   Modules accepted: Orders

## 2017-05-16 NOTE — Patient Instructions (Signed)
1. Nu swab test of the vagina is obtained to rule out vaginitis 2. Pelvic ultrasound is scheduled to assess the uterus and ovaries 3. Follow-up appointment as scheduled 1 week after ultrasound 4. Hysteroscopy/D&C will likely be needed to follow up on the thickened lining since tamoxifen therapy is ongoing.

## 2017-05-17 ENCOUNTER — Ambulatory Visit (INDEPENDENT_AMBULATORY_CARE_PROVIDER_SITE_OTHER): Payer: Managed Care, Other (non HMO)

## 2017-05-17 DIAGNOSIS — Z853 Personal history of malignant neoplasm of breast: Secondary | ICD-10-CM

## 2017-05-17 DIAGNOSIS — R102 Pelvic and perineal pain: Secondary | ICD-10-CM | POA: Diagnosis not present

## 2017-05-21 ENCOUNTER — Ambulatory Visit: Payer: Self-pay | Admitting: Physician Assistant

## 2017-05-21 DIAGNOSIS — E538 Deficiency of other specified B group vitamins: Secondary | ICD-10-CM

## 2017-05-21 MED ORDER — CYANOCOBALAMIN 1000 MCG/ML IJ SOLN
1000.0000 ug | Freq: Once | INTRAMUSCULAR | Status: AC
  Administered 2017-05-21: 1000 ug via INTRAMUSCULAR

## 2017-05-21 NOTE — Progress Notes (Signed)
Patient came in to have her scheduled vitamin b12 injection.

## 2017-05-22 LAB — NUSWAB BV AND CANDIDA, NAA
Candida albicans, NAA: NEGATIVE
Candida glabrata, NAA: NEGATIVE

## 2017-05-24 ENCOUNTER — Encounter: Payer: Self-pay | Admitting: Obstetrics and Gynecology

## 2017-05-24 ENCOUNTER — Ambulatory Visit (INDEPENDENT_AMBULATORY_CARE_PROVIDER_SITE_OTHER): Payer: Managed Care, Other (non HMO) | Admitting: Obstetrics and Gynecology

## 2017-05-24 VITALS — BP 106/67 | HR 85 | Ht 66.0 in | Wt 147.0 lb

## 2017-05-24 DIAGNOSIS — Z853 Personal history of malignant neoplasm of breast: Secondary | ICD-10-CM | POA: Diagnosis not present

## 2017-05-24 DIAGNOSIS — R9389 Abnormal findings on diagnostic imaging of other specified body structures: Secondary | ICD-10-CM | POA: Diagnosis not present

## 2017-05-24 DIAGNOSIS — R102 Pelvic and perineal pain: Secondary | ICD-10-CM

## 2017-05-24 NOTE — Patient Instructions (Signed)
1. Hysteroscopy/D&C and laparoscopic bilateral salpingo-oophorectomy is recommended 2. Return for preop appointment the week before surgery

## 2017-05-24 NOTE — Progress Notes (Signed)
Chief complaint: 1. Pelvic pain 2. History of thickened endometrium 3. Breast cancer, on tamoxifen therapy 4. Ultrasound follow-up  Patient presents for conference to discuss ultrasound findings and proceed with further management planning regarding surgical evaluation of patient concerns. Patient continues to have bilateral pelvic pain.  Pelvic ultrasound: ULTRASOUND REPORT  Location: [facility] Date of Service: 05/17/17   Indications:Pelvic pain w/ Hx of breast cancer; on tamoxifen Findings:  The uterus measures 7.4 x 2.7 x 3.3 cm. Echo texture is homogeneous without evidence of focal masses. The Endometrium measures 7.8 mm.  Neither ovary was visualized due to overlying bowel gas Survey of the adnexa demonstrates no adnexal masses. There is no free fluid in the cul de sac.  Impression: 1. Uterus of normal size and contour with thickened endometrium. 2. Endometrial stripe measures 7.8 mm  Recommendations: 1.Clinical correlation with the patient's History and Physical Exam. 2. Endometrial sampling is indicated  Dario Ave, RDMS Brayton Mars, MD   OBJECTIVE: BP 106/67   Pulse 85   Ht 5\' 6"  (1.676 m)   Wt 147 lb (66.7 kg)   BMI 23.73 kg/m  Physical exam-deferred  ASSESSMENT: 1. Bilateral pelvic pain, unclear etiology 2. Thickened endometrium on ultrasound, needing sampling 3. History of breast cancer on tamoxifen therapy  PLAN: 1. Recommend hysteroscopy/D&C for evaluation of thickened endometrium 2. Recommend laparoscopy with probable BSO for evaluation and management of pelvic pain in patient with history of breast cancer 3. Return for preoperative appointment prior surgery  A total of 15 minutes were spent face-to-face with the patient during this encounter and over half of that time dealt with counseling and coordination of care.  Brayton Mars, MD  Note: This dictation was prepared with Dragon dictation along with  smaller phrase technology. Any transcriptional errors that result from this process are unintentional.

## 2017-05-28 ENCOUNTER — Other Ambulatory Visit: Payer: Self-pay | Admitting: Physician Assistant

## 2017-05-29 ENCOUNTER — Other Ambulatory Visit: Payer: Managed Care, Other (non HMO)

## 2017-05-29 ENCOUNTER — Telehealth: Payer: Self-pay | Admitting: Emergency Medicine

## 2017-05-29 NOTE — Telephone Encounter (Signed)
I called and spoke with Inez Catalina at [facility] regarding the patient's referral.  Inez Catalina will be contacting the patient today to set her appointment up for 06/18/2017.

## 2017-05-29 NOTE — Telephone Encounter (Signed)
spreadsheet

## 2017-05-30 ENCOUNTER — Ambulatory Visit: Payer: Self-pay | Admitting: Physician Assistant

## 2017-05-30 VITALS — BP 94/53 | HR 81 | Temp 97.7°F | Resp 16

## 2017-05-30 DIAGNOSIS — R5383 Other fatigue: Secondary | ICD-10-CM

## 2017-05-30 DIAGNOSIS — Z299 Encounter for prophylactic measures, unspecified: Secondary | ICD-10-CM

## 2017-05-30 NOTE — Progress Notes (Signed)
Patient's bone density scan has been scheduled for 07/03/17 at 1:40pm.  I called and left a message on the patient's voicemail.

## 2017-05-30 NOTE — Progress Notes (Signed)
S: pt here for recheck of tsh and b12, would like to see if she can get a bone density test, states its been several years, also wants to discuss her current gyn situation, was recommended by her doctor to have her ovaries removed when she gets dnc, would just like to know if I think that's a good idea, no other complaints  O: vitals wnl, nad, lungs c ta , cv rrr  A: fatigue, hypothyroidism, consult  P: labs, order for bone density, agree with her doctor, explained to her that Dr Enzo Bi is one of the best doctors in the area and I truly trust his opinion

## 2017-05-31 LAB — SPECIMEN STATUS

## 2017-05-31 LAB — B12 AND FOLATE PANEL
Folate: 13.1 ng/mL (ref >3.0)
Vitamin B-12: 1456 pg/mL — ABNORMAL HIGH (ref 232–1245)

## 2017-05-31 LAB — THYROID PANEL WITH TSH
FREE THYROXINE INDEX: 4.2 (ref 1.2–4.9)
T3 Uptake Ratio: 25 % (ref 24–39)
T4 TOTAL: 16.8 ug/dL — AB (ref 4.5–12.0)
TSH: 0.143 u[IU]/mL — AB (ref 0.450–4.500)

## 2017-06-01 MED ORDER — LEVOTHYROXINE SODIUM 88 MCG PO TABS: 88.0000 ug | ORAL_TABLET | Freq: Every day | ORAL | 3 refills | Status: DC

## 2017-06-01 NOTE — Progress Notes (Signed)
Pt didn't want to alternate 75 and 132mcg synthroid, called in 19mcg, will recheck in 1 month

## 2017-06-01 NOTE — Addendum Note (Signed)
Addended by: Versie Starks on: 06/01/2017 01:30 PM   Modules accepted: Orders

## 2017-06-04 ENCOUNTER — Telehealth: Payer: Self-pay | Admitting: Obstetrics and Gynecology

## 2017-06-04 NOTE — Telephone Encounter (Signed)
Patient LVM that she has a few questions about surgery and would like to talk to Dr D's nurse  Please call

## 2017-06-05 ENCOUNTER — Telehealth: Payer: Self-pay | Admitting: Obstetrics and Gynecology

## 2017-06-05 ENCOUNTER — Encounter: Payer: Managed Care, Other (non HMO) | Admitting: Obstetrics and Gynecology

## 2017-06-05 NOTE — Telephone Encounter (Signed)
Patient called and stated that she would like to speak with a nurse, she has a few questions that she would like answered. No other information was disclosed. Please advise.

## 2017-06-05 NOTE — Telephone Encounter (Signed)
Pt aware surgery is out patient. She states per her insurance her surgery will need a PA.  She prefers 07/02/2017 if available. Aware I will send to [facility] and she will contact pt to confirm surgery date.

## 2017-06-06 NOTE — Telephone Encounter (Signed)
Spoke with patient today and have her scheduled for pre admit.

## 2017-06-15 DIAGNOSIS — E538 Deficiency of other specified B group vitamins: Secondary | ICD-10-CM | POA: Insufficient documentation

## 2017-06-15 DIAGNOSIS — R002 Palpitations: Secondary | ICD-10-CM | POA: Insufficient documentation

## 2017-06-15 DIAGNOSIS — E039 Hypothyroidism, unspecified: Secondary | ICD-10-CM | POA: Insufficient documentation

## 2017-06-15 DIAGNOSIS — E782 Mixed hyperlipidemia: Secondary | ICD-10-CM | POA: Insufficient documentation

## 2017-06-21 ENCOUNTER — Ambulatory Visit: Payer: Self-pay | Admitting: Physician Assistant

## 2017-06-21 DIAGNOSIS — Z299 Encounter for prophylactic measures, unspecified: Secondary | ICD-10-CM

## 2017-06-21 MED ORDER — CYANOCOBALAMIN 1000 MCG/ML IJ SOLN
1000.0000 ug | Freq: Once | INTRAMUSCULAR | Status: AC
  Administered 2017-06-21: 1000 ug via INTRAMUSCULAR

## 2017-06-21 NOTE — Progress Notes (Signed)
Patient came in to get her scheduled Vitamin B12 injection.

## 2017-06-26 ENCOUNTER — Encounter: Payer: Self-pay | Admitting: Obstetrics and Gynecology

## 2017-06-26 ENCOUNTER — Other Ambulatory Visit: Payer: Self-pay

## 2017-06-26 ENCOUNTER — Ambulatory Visit (INDEPENDENT_AMBULATORY_CARE_PROVIDER_SITE_OTHER): Payer: Managed Care, Other (non HMO) | Admitting: Obstetrics and Gynecology

## 2017-06-26 ENCOUNTER — Encounter
Admission: RE | Admit: 2017-06-26 | Discharge: 2017-06-26 | Disposition: A | Discharge status: Home / Self Care | Payer: Managed Care, Other (non HMO) | Source: Ambulatory Visit | Attending: Obstetrics and Gynecology | Admitting: Obstetrics and Gynecology

## 2017-06-26 VITALS — BP 100/58 | Ht 66.0 in | Wt 143.9 lb

## 2017-06-26 DIAGNOSIS — Z01812 Encounter for preprocedural laboratory examination: Secondary | ICD-10-CM | POA: Diagnosis not present

## 2017-06-26 DIAGNOSIS — R9389 Abnormal findings on diagnostic imaging of other specified body structures: Secondary | ICD-10-CM

## 2017-06-26 DIAGNOSIS — Z01818 Encounter for other preprocedural examination: Secondary | ICD-10-CM

## 2017-06-26 DIAGNOSIS — R102 Pelvic and perineal pain: Secondary | ICD-10-CM

## 2017-06-26 DIAGNOSIS — Z853 Personal history of malignant neoplasm of breast: Secondary | ICD-10-CM

## 2017-06-26 LAB — CBC WITH DIFFERENTIAL/PLATELET
BASOS ABS: 0.1 10*3/uL (ref 0–0.1)
BASOS PCT: 1 %
Eosinophils Absolute: 0.2 10*3/uL (ref 0–0.7)
Eosinophils Relative: 3 %
HEMATOCRIT: 39.4 % (ref 35.0–47.0)
HEMOGLOBIN: 13 g/dL (ref 12.0–16.0)
LYMPHS PCT: 36 %
Lymphs Abs: 2.2 10*3/uL (ref 1.0–3.6)
MCH: 30.6 pg (ref 26.0–34.0)
MCHC: 33.1 g/dL (ref 32.0–36.0)
MCV: 92.4 fL (ref 80.0–100.0)
Monocytes Absolute: 0.5 10*3/uL (ref 0.2–0.9)
Monocytes Relative: 9 %
NEUTROS ABS: 3.2 10*3/uL (ref 1.4–6.5)
NEUTROS PCT: 51 %
Platelets: 309 10*3/uL (ref 150–440)
RBC: 4.26 MIL/uL (ref 3.80–5.20)
RDW: 13.8 % (ref 11.5–14.5)
WBC: 6.3 10*3/uL (ref 3.6–11.0)

## 2017-06-26 LAB — TYPE AND SCREEN
ABO/RH(D): O POS
Antibody Screen: NEGATIVE

## 2017-06-26 LAB — RAPID HIV SCREEN (HIV 1/2 AB+AG)
HIV 1/2 ANTIBODIES: NONREACTIVE
HIV-1 P24 Antigen - HIV24: NONREACTIVE

## 2017-06-26 NOTE — H&P (Signed)
Subjective: PREOPERATIVE HISTORY AND PHYSICAL  Date of surgery: 07/02/2017 Procedures: 1.  Hysteroscopy/D&C 2.  Laparoscopy with BSO Diagnoses: 1.  Thickened endometrium on ultrasound 2.  History of breast cancer, on tamoxifen therapy 3.  Pelvic pain   Patient is a 61 y.o. G1P1062female scheduled for surgery on 07/02/2017. Hysteroscopy/D&C is to be performed to assess thickened endometrium on pelvic ultrasound.  She is not experiencing any postmenopausal bleeding.  However, she is on tamoxifen for breast cancer therapy-year 8 out of 10 Patient continues to have chronic pelvic pain bilaterally. Pelvic pain is characterized as bilateral achiness 3/10 in intensity.  She is not taking any medication for the pain. There is no significant change in bowel or bladder. Patient has history of a heart murmur identified during breast cancer disease 10 years ago.  She has had recent palpitations and is undergoing a cardiac echo the week prior to surgery.  Clinical exam is currently normal.  ULTRASOUND REPORT  Location: [facility] Date of Service: 05/17/17   Indications:Pelvic pain w/ Hx of breast cancer; on tamoxifen Findings: The uterus measures 7.4 x 2.7 x 3.3 cm. Echo texture is homogeneous without evidence of focal masses. The Endometriummeasures 7.8 mm.  Neither ovary was visualized due to overlying bowel gas Survey of the adnexa demonstrates no adnexal masses. There is no free fluid in the cul de sac.  Impression: 1. Uterus of normal size and contour with thickened endometrium. 2. Endometrial stripe measures 7.8 mm  Recommendations: 1.Clinical correlation with the patient's History and Physical Exam. 2. Endometrial sampling is indicated  Dario Ave, RDMS Brayton Mars, MD    Gynecologic History No LMP recorded. Patient is postmenopausal. Contraception: post menopausal status Last Pap:07/2015 neg/negResults were: wnl Last mammogram: 2017- thru  [facility]. Results were: normal  OB History    Gravida Para Term Preterm AB Living   1 1 1     1    SAB TAB Ectopic Multiple Live Births           1      No LMP recorded. Patient is postmenopausal.    Past Medical History:  Diagnosis Date  . Anemia    pernicious  . AR (allergic rhinitis)   . Breast cancer ([place])    h/o- infiltrating ductal carcinoma  . DJD (degenerative joint disease)   . DJD (degenerative joint disease)   . Hypercholesteremia   . Hypertension    pt reports LOW BP  . Hypoglycemia   . Hypothyroid   . Menopausal state   . Multinodular goiter   . Osteopenia   . Osteoporosis   . PONV (postoperative nausea and vomiting)    after lumpectomy.  Has been pre-medicated since then  . Seasonal allergies   . Severe dysplasia of cervix (CIN III)   . SUI (stress urinary incontinence, female) 05/20/2015  . Urinary incontinence   . Vaginal atrophy   . Vitamin D deficiency     Past Surgical History:  Procedure Laterality Date  . BREAST LUMPECTOMY    . CERVICAL BIOPSY  W/ LOOP ELECTRODE EXCISION    . CERVICAL CONE BIOPSY    . CESAREAN SECTION    . ENDOSCOPIC MAXILLARY ANTROSTOMY Bilateral 09/02/2015   Performed by Margaretha Sheffield, MD at [place]  . IMAGE GUIDED SINUS SURGERY N/A 09/02/2015   Performed by Margaretha Sheffield, MD at [place]  . MASTECTOMY Right 2010   total - infiltrating ductal carcinoma  . SEPTOPLASTY N/A 09/02/2015   Performed by  Margaretha Sheffield, MD at [place]  . TOTAL ETHMOIDECTOMY Bilateral 09/02/2015   Performed by Margaretha Sheffield, MD at [place]    OB History  Gravida Para Term Preterm AB Living  1 1 1     1   SAB TAB Ectopic Multiple Live Births          1    # Outcome Date GA Lbr Len/2nd Weight Sex Delivery Anes PTL Lv  1 Term      Vag-Spont   LIV      Social History   Socioeconomic History  . Marital status: Married    Spouse name: None  . Number of children: None  . Years of education: None  .  Highest education level: None  Social Needs  . Financial resource strain: None  . Food insecurity - worry: None  . Food insecurity - inability: None  . Transportation needs - medical: None  . Transportation needs - non-medical: None  Occupational History  . None  Tobacco Use  . Smoking status: Never Smoker  . Smokeless tobacco: Never Used  Substance and Sexual Activity  . Alcohol use: Yes    Comment: occas  . Drug use: No  . Sexual activity: Yes    Birth control/protection: Post-menopausal  Other Topics Concern  . None  Social History Narrative  . None    Family History  Problem Relation Age of Onset  . Lung cancer Mother   . Lung cancer Father   . Ovarian cancer Neg Hx   . Breast cancer Neg Hx   . Cancer Neg Hx   . Diabetes Neg Hx   . Heart disease Neg Hx      (Not in a hospital admission)  Allergies  Allergen Reactions  . Ampicillin Rash and Other (See Comments)    Has patient had a PCN reaction causing immediate rash, facial/tongue/throat swelling, SOB or lightheadedness with hypotension: Yes Has patient had a PCN reaction causing severe rash involving mucus membranes or skin necrosis: No Has patient had a PCN reaction that required hospitalization: No Has patient had a PCN reaction occurring within the last 10 years: No If all of the above answers are "NO", then may proceed with Cephalosporin use.     Review of Systems Constitutional: No recent fever/chills/sweats Respiratory: No recent cough/bronchitis Cardiovascular: No chest pain Gastrointestinal: No recent nausea/vomiting/diarrhea Genitourinary: No UTI symptoms Hematologic/lymphatic:No history of coagulopathy or recent blood thinner use    Objective:    BP (!) 100/58   Ht 5\' 6"  (1.676 m)   Wt 143 lb 14.4 oz (65.3 kg)   BMI 23.23 kg/m   General:   Normal  Skin:   normal  HEENT:  Normal  Neck:  Supple without Adenopathy or Thyromegaly  Lungs:   Heart:              Breasts:   Abdomen:   Pelvis:  M/S   Extremeties:  Neuro:    clear to auscultation bilaterally   Normal without murmur   Not Examined   soft, non-tender; bowel sounds normal; no masses,  no organomegaly   Exam deferred to OR  No CVAT  Warm/Dry   Normal          Assessment:    1.  Thickened endometrium on ultrasound 2.  Bilateral pelvic pain 3.  History of breast cancer, on tamoxifen therapy Plan:  1.  Hysteroscopy/D&C 2.  Laparoscopy with BSO  Preop counseling: The patient is to undergo hysteroscopy/D&C  as well as laparoscopy with BSO on 07/02/2017.  She is understanding of the planned procedures and is aware of and is accepting of all surgical risks which include but are not limited to bleeding, infection, pelvic organ injury with need for repair, blood clot disorders, anesthesia risk, etc.  All questions have been answered.  Informed consent is given.  Patient is ready and willing to proceed with surgery as scheduled.  Brayton Mars, MD  Note: This dictation was prepared with Dragon dictation along with smaller phrase technology. Any transcriptional errors that result from this process are unintentional.

## 2017-06-26 NOTE — Pre-Procedure Instructions (Addendum)
ECG 12-lead11/07/2017 [place] Component Name Value Ref Range  Vent Rate (bpm) 75   PR Interval (msec) 176   QRS Interval (msec) 76   QT Interval (msec) 352   QTc (msec) 393   Other Result Information  This result has an attachment that is not available.  Result Narrative  Normal sinus rhythm ST elevation, probably due to early repolarization Otherwise normal ECG  When compared with ECG of 14-Dec-2004 10:48, No significant change was found I reviewed and concur with this report. Electronically signed IR:WERXVQ, MD, CHRISTOPHER [phone]) on 06/18/2017 5:00:27 PM   Pt is scheduled to have an Echo tomorrow 06/27/2017.

## 2017-06-26 NOTE — Progress Notes (Signed)
Subjective: PREOPERATIVE HISTORY AND PHYSICAL  Date of surgery: 07/02/2017 Procedures: 1.  Hysteroscopy/D&C 2.  Laparoscopy with BSO Diagnoses: 1.  Thickened endometrium on ultrasound 2.  History of breast cancer, on tamoxifen therapy 3.  Pelvic pain   Patient is a 61 y.o. G1P1075female scheduled for surgery on 07/02/2017. Hysteroscopy/D&C is to be performed to assess thickened endometrium on pelvic ultrasound.  She is not experiencing any postmenopausal bleeding.  However, she is on tamoxifen for breast cancer therapy-year 8 out of 10 Patient continues to have chronic pelvic pain bilaterally. Pelvic pain is characterized as bilateral achiness 3/10 in intensity.  She is not taking any medication for the pain. There is no significant change in bowel or bladder. Patient has history of a heart murmur identified during breast cancer disease 10 years ago.  She has had recent palpitations and is undergoing a cardiac echo the week prior to surgery.  Clinical exam is currently normal.  ULTRASOUND REPORT  Location: [facility] Date of Service: 05/17/17   Indications:Pelvic pain w/ Hx of breast cancer; on tamoxifen Findings: The uterus measures 7.4 x 2.7 x 3.3 cm. Echo texture is homogeneous without evidence of focal masses. The Endometriummeasures 7.8 mm.  Neither ovary was visualized due to overlying bowel gas Survey of the adnexa demonstrates no adnexal masses. There is no free fluid in the cul de sac.  Impression: 1. Uterus of normal size and contour with thickened endometrium. 2. Endometrial stripe measures 7.8 mm  Recommendations: 1.Clinical correlation with the patient's History and Physical Exam. 2. Endometrial sampling is indicated  Dario Ave, RDMS Brayton Mars, MD    Gynecologic History No LMP recorded. Patient is postmenopausal. Contraception: post menopausal status Last Pap:07/2015 neg/negResults were: wnl Last [facility]ogram: 2017- [facility]. Results were: normal  OB History    Gravida Para Term Preterm AB Living   1 1 1     1    SAB TAB Ectopic Multiple Live Births           1      No LMP recorded. Patient is postmenopausal.    Past Medical History:  Diagnosis Date  . Anemia    pernicious  . AR (allergic rhinitis)   . Breast cancer ([place])    h/o- infiltrating ductal carcinoma  . DJD (degenerative joint disease)   . DJD (degenerative joint disease)   . Hypercholesteremia   . Hypertension    pt reports LOW BP  . Hypoglycemia   . Hypothyroid   . Menopausal state   . Multinodular goiter   . Osteopenia   . Osteoporosis   . PONV (postoperative nausea and vomiting)    after lumpectomy.  Has been pre-medicated since then  . Seasonal allergies   . Severe dysplasia of cervix (CIN III)   . SUI (stress urinary incontinence, female) 05/20/2015  . Urinary incontinence   . Vaginal atrophy   . Vitamin D deficiency     Past Surgical History:  Procedure Laterality Date  . BREAST LUMPECTOMY    . CERVICAL BIOPSY  W/ LOOP ELECTRODE EXCISION    . CERVICAL CONE BIOPSY    . CESAREAN SECTION    . ENDOSCOPIC MAXILLARY ANTROSTOMY Bilateral 09/02/2015   Performed by Margaretha Sheffield, MD at [place]  . IMAGE GUIDED SINUS SURGERY N/A 09/02/2015   Performed by Margaretha Sheffield, MD at [place]  . MASTECTOMY Right 2010   total - infiltrating ductal carcinoma  . SEPTOPLASTY N/A 09/02/2015   Performed by  Margaretha Sheffield, MD at [place]  . TOTAL ETHMOIDECTOMY Bilateral 09/02/2015   Performed by Margaretha Sheffield, MD at [place]    OB History  Gravida Para Term Preterm AB Living  1 1 1     1   SAB TAB Ectopic Multiple Live Births          1    # Outcome Date GA Lbr Len/2nd Weight Sex Delivery Anes PTL Lv  1 Term      Vag-Spont   LIV      Social History   Socioeconomic History  . Marital status: Married    Spouse name: None  . Number of children: None  . Years of education: None  .  Highest education level: None  Social Needs  . Financial resource strain: None  . Food insecurity - worry: None  . Food insecurity - inability: None  . Transportation needs - medical: None  . Transportation needs - non-medical: None  Occupational History  . None  Tobacco Use  . Smoking status: Never Smoker  . Smokeless tobacco: Never Used  Substance and Sexual Activity  . Alcohol use: Yes    Comment: occas  . Drug use: No  . Sexual activity: Yes    Birth control/protection: Post-menopausal  Other Topics Concern  . None  Social History Narrative  . None    Family History  Problem Relation Age of Onset  . Lung cancer Mother   . Lung cancer Father   . Ovarian cancer Neg Hx   . Breast cancer Neg Hx   . Cancer Neg Hx   . Diabetes Neg Hx   . Heart disease Neg Hx      (Not in a hospital admission)  Allergies  Allergen Reactions  . Ampicillin Rash and Other (See Comments)    Has patient had a PCN reaction causing immediate rash, facial/tongue/throat swelling, SOB or lightheadedness with hypotension: Yes Has patient had a PCN reaction causing severe rash involving mucus membranes or skin necrosis: No Has patient had a PCN reaction that required hospitalization: No Has patient had a PCN reaction occurring within the last 10 years: No If all of the above answers are "NO", then may proceed with Cephalosporin use.     Review of Systems Constitutional: No recent fever/chills/sweats Respiratory: No recent cough/bronchitis Cardiovascular: No chest pain Gastrointestinal: No recent nausea/vomiting/diarrhea Genitourinary: No UTI symptoms Hematologic/lymphatic:No history of coagulopathy or recent blood thinner use    Objective:    BP (!) 100/58   Ht 5\' 6"  (1.676 m)   Wt 143 lb 14.4 oz (65.3 kg)   BMI 23.23 kg/m   General:   Normal  Skin:   normal  HEENT:  Normal  Neck:  Supple without Adenopathy or Thyromegaly  Lungs:   Heart:              Breasts:   Abdomen:   Pelvis:  M/S   Extremeties:  Neuro:    clear to auscultation bilaterally   Normal without murmur   Not Examined   soft, non-tender; bowel sounds normal; no masses,  no organomegaly   Exam deferred to OR  No CVAT  Warm/Dry   Normal          Assessment:    1.  Thickened endometrium on ultrasound 2.  Bilateral pelvic pain 3.  History of breast cancer, on tamoxifen therapy Plan:  1.  Hysteroscopy/D&C 2.  Laparoscopy with BSO  Preop counseling: The patient is to undergo hysteroscopy/D&C  as well as laparoscopy with BSO on 07/02/2017.  She is understanding of the planned procedures and is aware of and is accepting of all surgical risks which include but are not limited to bleeding, infection, pelvic organ injury with need for repair, blood clot disorders, anesthesia risk, etc.  All questions have been answered.  Informed consent is given.  Patient is ready and willing to proceed with surgery as scheduled.  Brayton Mars, MD  Note: This dictation was prepared with Dragon dictation along with smaller phrase technology. Any transcriptional errors that result from this process are unintentional.

## 2017-06-26 NOTE — Patient Instructions (Signed)
  Your procedure is scheduled TD:VVOHYW Nov. 26., 2018. Report to Same Day Surgery. To find out your arrival time please call [phone] between 1PM - 3PM on Friday Nov. 23, 2018 .  Remember: Instructions that are not followed completely may result in serious medical risk, up to and including death, or upon the discretion of your surgeon and anesthesiologist your surgery may need to be rescheduled.    _x___ 1. Do not eat food after midnight night prior to surgery. No gum   chewing or hard candies, snacks or breakfast.    Jan 24, 2023 drink the following: water, Gatorade, clear apple juice, black coffee     or black tea up until 2 hours prior to ARRIVAL time.     __x__ 2. No Alcohol for 24 hours before or after surgery.   ____ 3. Bring all medications with you on the day of surgery if instructed.    __x__ 4. Notify your doctor if there is any change in your medical condition     (cold, fever, infections).    _____ 5.   Do Not Smoke or use e-cigarettes For 24 Hours Prior to Your   Surgery.  Do not use any chewable tobacco products for at least 6   hours prior to  surgery.                      Do not wear jewelry, make-up, hairpins, clips or nail polish.  Do not wear lotions, powders, or perfumes.   Do not shave 48 hours prior to surgery. Men may shave face and neck.  Do not bring valuables to the hospital.    [facility] is not responsible for any belongings or valuables.               Contacts, dentures or bridgework may not be worn into surgery.  Leave your suitcase in the car. After surgery it may be brought to your room.  For patients admitted to the hospital, discharge time is determined by your  treatment team.   Patients discharged the day of surgery will not be allowed to drive home.    Please read over the following fact sheets that you were given:   [facility] Preparing for Surgery  __x_ Take these medicines the morning of surgery with A SIP OF WATER:    1. cetirizine  (ZYRTEC)  2. levothyroxine (SYNTHROID, LEVOTHROID)  3. Pseudoephedrine HCl (SUDAFED PO)      ____ Fleet Enema (as directed)   __x__ Use CHG Soap as directed on instruction sheet  ____ Use inhalers on the day of surgery and bring to hospital day of surgery  ____ Stop metformin 2 days prior to surgery    ____ Take 1/2 of usual insulin dose the night before surgery and none on the morning of          surgery.   ____ Stop Eliquis/Coumadin/Plavix/aspirin on does not apply.  _x___ Stop Anti-inflammatories such as Advil, Aleve, Ibuprofen, Motrin, Naproxen,  Naprosyn, Goodies powders or aspirin products. OK to take Tylenol.   ____ Stop supplements until after surgery.    ____ Bring C-Pap to the hospital.

## 2017-06-26 NOTE — Patient Instructions (Signed)
1.  Return for postop check 1 week after surgery 

## 2017-06-26 NOTE — H&P (View-Only) (Signed)
Subjective: PREOPERATIVE HISTORY AND PHYSICAL  Date of surgery: 07/02/2017 Procedures: 1.  Hysteroscopy/D&C 2.  Laparoscopy with BSO Diagnoses: 1.  Thickened endometrium on ultrasound 2.  History of breast cancer, on tamoxifen therapy 3.  Pelvic pain   Patient is a 61 y.o. G1P1038female scheduled for surgery on 07/02/2017. Hysteroscopy/D&C is to be performed to assess thickened endometrium on pelvic ultrasound.  She is not experiencing any postmenopausal bleeding.  However, she is on tamoxifen for breast cancer therapy-year 8 out of 10 Patient continues to have chronic pelvic pain bilaterally. Pelvic pain is characterized as bilateral achiness 3/10 in intensity.  She is not taking any medication for the pain. There is no significant change in bowel or bladder. Patient has history of a heart murmur identified during breast cancer disease 10 years ago.  She has had recent palpitations and is undergoing a cardiac echo the week prior to surgery.  Clinical exam is currently normal.  ULTRASOUND REPORT  Location: [facility] Date of Service: 05/17/17   Indications:Pelvic pain w/ Hx of breast cancer; on tamoxifen Findings: The uterus measures 7.4 x 2.7 x 3.3 cm. Echo texture is homogeneous without evidence of focal masses. The Endometriummeasures 7.8 mm.  Neither ovary was visualized due to overlying bowel gas Survey of the adnexa demonstrates no adnexal masses. There is no free fluid in the cul de sac.  Impression: 1. Uterus of normal size and contour with thickened endometrium. 2. Endometrial stripe measures 7.8 mm  Recommendations: 1.Clinical correlation with the patient's History and Physical Exam. 2. Endometrial sampling is indicated  Dario Ave, RDMS Brayton Mars, MD    Gynecologic History No LMP recorded. Patient is postmenopausal. Contraception: post menopausal status Last Pap:07/2015 neg/negResults were: wnl Last [facility]ogram: 2017- [facility]. Results were: normal  OB History    Gravida Para Term Preterm AB Living   1 1 1     1    SAB TAB Ectopic Multiple Live Births           1      No LMP recorded. Patient is postmenopausal.    Past Medical History:  Diagnosis Date  . Anemia    pernicious  . AR (allergic rhinitis)   . Breast cancer ([place])    h/o- infiltrating ductal carcinoma  . DJD (degenerative joint disease)   . DJD (degenerative joint disease)   . Hypercholesteremia   . Hypertension    pt reports LOW BP  . Hypoglycemia   . Hypothyroid   . Menopausal state   . Multinodular goiter   . Osteopenia   . Osteoporosis   . PONV (postoperative nausea and vomiting)    after lumpectomy.  Has been pre-medicated since then  . Seasonal allergies   . Severe dysplasia of cervix (CIN III)   . SUI (stress urinary incontinence, female) 05/20/2015  . Urinary incontinence   . Vaginal atrophy   . Vitamin D deficiency     Past Surgical History:  Procedure Laterality Date  . BREAST LUMPECTOMY    . CERVICAL BIOPSY  W/ LOOP ELECTRODE EXCISION    . CERVICAL CONE BIOPSY    . CESAREAN SECTION    . ENDOSCOPIC MAXILLARY ANTROSTOMY Bilateral 09/02/2015   Performed by Margaretha Sheffield, MD at [place]  . IMAGE GUIDED SINUS SURGERY N/A 09/02/2015   Performed by Margaretha Sheffield, MD at [place]  . MASTECTOMY Right 2010   total - infiltrating ductal carcinoma  . SEPTOPLASTY N/A 09/02/2015   Performed by  Margaretha Sheffield, MD at [place]  . TOTAL ETHMOIDECTOMY Bilateral 09/02/2015   Performed by Margaretha Sheffield, MD at [place]    OB History  Gravida Para Term Preterm AB Living  1 1 1     1   SAB TAB Ectopic Multiple Live Births          1    # Outcome Date GA Lbr Len/2nd Weight Sex Delivery Anes PTL Lv  1 Term      Vag-Spont   LIV      Social History   Socioeconomic History  . Marital status: Married    Spouse name: None  . Number of children: None  . Years of education: None  .  Highest education level: None  Social Needs  . Financial resource strain: None  . Food insecurity - worry: None  . Food insecurity - inability: None  . Transportation needs - medical: None  . Transportation needs - non-medical: None  Occupational History  . None  Tobacco Use  . Smoking status: Never Smoker  . Smokeless tobacco: Never Used  Substance and Sexual Activity  . Alcohol use: Yes    Comment: occas  . Drug use: No  . Sexual activity: Yes    Birth control/protection: Post-menopausal  Other Topics Concern  . None  Social History Narrative  . None    Family History  Problem Relation Age of Onset  . Lung cancer Mother   . Lung cancer Father   . Ovarian cancer Neg Hx   . Breast cancer Neg Hx   . Cancer Neg Hx   . Diabetes Neg Hx   . Heart disease Neg Hx      (Not in a hospital admission)  Allergies  Allergen Reactions  . Ampicillin Rash and Other (See Comments)    Has patient had a PCN reaction causing immediate rash, facial/tongue/throat swelling, SOB or lightheadedness with hypotension: Yes Has patient had a PCN reaction causing severe rash involving mucus membranes or skin necrosis: No Has patient had a PCN reaction that required hospitalization: No Has patient had a PCN reaction occurring within the last 10 years: No If all of the above answers are "NO", then may proceed with Cephalosporin use.     Review of Systems Constitutional: No recent fever/chills/sweats Respiratory: No recent cough/bronchitis Cardiovascular: No chest pain Gastrointestinal: No recent nausea/vomiting/diarrhea Genitourinary: No UTI symptoms Hematologic/lymphatic:No history of coagulopathy or recent blood thinner use    Objective:    BP (!) 100/58   Ht 5\' 6"  (1.676 m)   Wt 143 lb 14.4 oz (65.3 kg)   BMI 23.23 kg/m   General:   Normal  Skin:   normal  HEENT:  Normal  Neck:  Supple without Adenopathy or Thyromegaly  Lungs:   Heart:              Breasts:   Abdomen:   Pelvis:  M/S   Extremeties:  Neuro:    clear to auscultation bilaterally   Normal without murmur   Not Examined   soft, non-tender; bowel sounds normal; no masses,  no organomegaly   Exam deferred to OR  No CVAT  Warm/Dry   Normal          Assessment:    1.  Thickened endometrium on ultrasound 2.  Bilateral pelvic pain 3.  History of breast cancer, on tamoxifen therapy Plan:  1.  Hysteroscopy/D&C 2.  Laparoscopy with BSO  Preop counseling: The patient is to undergo hysteroscopy/D&C  as well as laparoscopy with BSO on 07/02/2017.  She is understanding of the planned procedures and is aware of and is accepting of all surgical risks which include but are not limited to bleeding, infection, pelvic organ injury with need for repair, blood clot disorders, anesthesia risk, etc.  All questions have been answered.  Informed consent is given.  Patient is ready and willing to proceed with surgery as scheduled.  Brayton Mars, MD  Note: This dictation was prepared with Dragon dictation along with smaller phrase technology. Any transcriptional errors that result from this process are unintentional.

## 2017-06-27 LAB — RPR: RPR Ser Ql: NONREACTIVE

## 2017-07-01 DIAGNOSIS — I341 Nonrheumatic mitral (valve) prolapse: Secondary | ICD-10-CM | POA: Insufficient documentation

## 2017-07-02 ENCOUNTER — Ambulatory Visit
Admission: RE | Admit: 2017-07-02 | Discharge: 2017-07-02 | Disposition: A | Discharge status: Home / Self Care | Payer: Managed Care, Other (non HMO) | Source: Ambulatory Visit | Attending: Obstetrics and Gynecology | Admitting: Obstetrics and Gynecology

## 2017-07-02 ENCOUNTER — Encounter
Admission: RE | Disposition: A | Discharge status: Home / Self Care | Payer: Self-pay | Source: Ambulatory Visit | Attending: Obstetrics and Gynecology

## 2017-07-02 ENCOUNTER — Other Ambulatory Visit: Payer: Self-pay

## 2017-07-02 ENCOUNTER — Ambulatory Visit: Payer: Managed Care, Other (non HMO) | Admitting: Anesthesiology

## 2017-07-02 ENCOUNTER — Encounter: Payer: Self-pay | Admitting: *Deleted

## 2017-07-02 ENCOUNTER — Other Ambulatory Visit: Payer: Self-pay | Admitting: Obstetrics and Gynecology

## 2017-07-02 DIAGNOSIS — J309 Allergic rhinitis, unspecified: Secondary | ICD-10-CM | POA: Insufficient documentation

## 2017-07-02 DIAGNOSIS — N736 Female pelvic peritoneal adhesions (postinfective): Secondary | ICD-10-CM

## 2017-07-02 DIAGNOSIS — I10 Essential (primary) hypertension: Secondary | ICD-10-CM | POA: Diagnosis not present

## 2017-07-02 DIAGNOSIS — R102 Pelvic and perineal pain: Secondary | ICD-10-CM | POA: Diagnosis not present

## 2017-07-02 DIAGNOSIS — Z7951 Long term (current) use of inhaled steroids: Secondary | ICD-10-CM | POA: Insufficient documentation

## 2017-07-02 DIAGNOSIS — Z853 Personal history of malignant neoplasm of breast: Secondary | ICD-10-CM | POA: Insufficient documentation

## 2017-07-02 DIAGNOSIS — R9389 Abnormal findings on diagnostic imaging of other specified body structures: Secondary | ICD-10-CM | POA: Insufficient documentation

## 2017-07-02 DIAGNOSIS — M810 Age-related osteoporosis without current pathological fracture: Secondary | ICD-10-CM | POA: Diagnosis not present

## 2017-07-02 DIAGNOSIS — Z9011 Acquired absence of right breast and nipple: Secondary | ICD-10-CM | POA: Diagnosis not present

## 2017-07-02 DIAGNOSIS — Z79899 Other long term (current) drug therapy: Secondary | ICD-10-CM | POA: Diagnosis not present

## 2017-07-02 DIAGNOSIS — D510 Vitamin B12 deficiency anemia due to intrinsic factor deficiency: Secondary | ICD-10-CM | POA: Diagnosis not present

## 2017-07-02 DIAGNOSIS — N838 Other noninflammatory disorders of ovary, fallopian tube and broad ligament: Secondary | ICD-10-CM | POA: Insufficient documentation

## 2017-07-02 DIAGNOSIS — Z79890 Hormone replacement therapy (postmenopausal): Secondary | ICD-10-CM | POA: Diagnosis not present

## 2017-07-02 DIAGNOSIS — E559 Vitamin D deficiency, unspecified: Secondary | ICD-10-CM | POA: Insufficient documentation

## 2017-07-02 DIAGNOSIS — Z9889 Other specified postprocedural states: Secondary | ICD-10-CM

## 2017-07-02 DIAGNOSIS — Z90722 Acquired absence of ovaries, bilateral: Secondary | ICD-10-CM

## 2017-07-02 DIAGNOSIS — E7800 Pure hypercholesterolemia, unspecified: Secondary | ICD-10-CM | POA: Insufficient documentation

## 2017-07-02 DIAGNOSIS — E039 Hypothyroidism, unspecified: Secondary | ICD-10-CM | POA: Insufficient documentation

## 2017-07-02 HISTORY — PX: HYSTEROSCOPY W/D&C: SHX1775

## 2017-07-02 HISTORY — PX: LAPAROSCOPIC BILATERAL SALPINGO OOPHERECTOMY: SHX5890

## 2017-07-02 LAB — TYPE AND SCREEN
ABO/RH(D): O POS
ANTIBODY SCREEN: NEGATIVE

## 2017-07-02 SURGERY — DILATATION AND CURETTAGE /HYSTEROSCOPY
Anesthesia: General | Wound class: Clean Contaminated

## 2017-07-02 MED ORDER — PHENYLEPHRINE HCL 10 MG/ML IJ SOLN
INTRAMUSCULAR | Status: DC | PRN
  Administered 2017-07-02: 100 ug via INTRAVENOUS

## 2017-07-02 MED ORDER — KETOROLAC TROMETHAMINE 30 MG/ML IJ SOLN
INTRAMUSCULAR | Status: AC
  Filled 2017-07-02: qty 1

## 2017-07-02 MED ORDER — IBUPROFEN 800 MG PO TABS
ORAL_TABLET | ORAL | Status: AC
  Filled 2017-07-02: qty 1

## 2017-07-02 MED ORDER — ROCURONIUM BROMIDE 100 MG/10ML IV SOLN
INTRAVENOUS | Status: DC | PRN
  Administered 2017-07-02: 30 mg via INTRAVENOUS
  Administered 2017-07-02: 10 mg via INTRAVENOUS

## 2017-07-02 MED ORDER — SUCCINYLCHOLINE CHLORIDE 20 MG/ML IJ SOLN
INTRAMUSCULAR | Status: AC
  Filled 2017-07-02: qty 1

## 2017-07-02 MED ORDER — BUPIVACAINE HCL (PF) 0.5 % IJ SOLN
INTRAMUSCULAR | Status: AC
  Filled 2017-07-02: qty 30

## 2017-07-02 MED ORDER — OXYCODONE-ACETAMINOPHEN 5-325 MG PO TABS: 1.0000 | ORAL_TABLET | ORAL | 0 refills | Status: DC | PRN

## 2017-07-02 MED ORDER — SUGAMMADEX SODIUM 200 MG/2ML IV SOLN
INTRAVENOUS | Status: AC
  Filled 2017-07-02: qty 2

## 2017-07-02 MED ORDER — ROCURONIUM BROMIDE 50 MG/5ML IV SOLN
INTRAVENOUS | Status: AC
  Filled 2017-07-02: qty 1

## 2017-07-02 MED ORDER — SUGAMMADEX SODIUM 200 MG/2ML IV SOLN
INTRAVENOUS | Status: DC | PRN
  Administered 2017-07-02: 200 mg via INTRAVENOUS

## 2017-07-02 MED ORDER — FAMOTIDINE 20 MG PO TABS
20.0000 mg | ORAL_TABLET | Freq: Once | ORAL | Status: AC
  Administered 2017-07-02: 20 mg via ORAL

## 2017-07-02 MED ORDER — PROPOFOL 10 MG/ML IV BOLUS
INTRAVENOUS | Status: DC | PRN
  Administered 2017-07-02: 120 mg via INTRAVENOUS

## 2017-07-02 MED ORDER — FENTANYL CITRATE (PF) 250 MCG/5ML IJ SOLN
INTRAMUSCULAR | Status: AC
Start: 2017-07-02 — End: ?
  Filled 2017-07-02: qty 5

## 2017-07-02 MED ORDER — PROPOFOL 10 MG/ML IV BOLUS
INTRAVENOUS | Status: AC
  Filled 2017-07-02: qty 20

## 2017-07-02 MED ORDER — LACTATED RINGERS IV SOLN
INTRAVENOUS | Status: DC
  Administered 2017-07-02 (×2): via INTRAVENOUS

## 2017-07-02 MED ORDER — DEXAMETHASONE SODIUM PHOSPHATE 10 MG/ML IJ SOLN
INTRAMUSCULAR | Status: AC
  Filled 2017-07-02: qty 1

## 2017-07-02 MED ORDER — DEXAMETHASONE SODIUM PHOSPHATE 10 MG/ML IJ SOLN
INTRAMUSCULAR | Status: DC | PRN
  Administered 2017-07-02: 5 mg via INTRAVENOUS

## 2017-07-02 MED ORDER — FENTANYL CITRATE (PF) 100 MCG/2ML IJ SOLN
INTRAMUSCULAR | Status: DC | PRN
  Administered 2017-07-02: 50 ug via INTRAVENOUS
  Administered 2017-07-02: 100 ug via INTRAVENOUS
  Administered 2017-07-02: 50 ug via INTRAVENOUS

## 2017-07-02 MED ORDER — FENTANYL CITRATE (PF) 100 MCG/2ML IJ SOLN
25.0000 ug | INTRAMUSCULAR | Status: DC | PRN
  Administered 2017-07-02: 50 ug via INTRAVENOUS

## 2017-07-02 MED ORDER — PHENYLEPHRINE HCL 10 MG/ML IJ SOLN
INTRAMUSCULAR | Status: AC
  Filled 2017-07-02: qty 1

## 2017-07-02 MED ORDER — MIDAZOLAM HCL 2 MG/2ML IJ SOLN
INTRAMUSCULAR | Status: DC | PRN
  Administered 2017-07-02: 2 mg via INTRAVENOUS

## 2017-07-02 MED ORDER — LIDOCAINE HCL (CARDIAC) 20 MG/ML IV SOLN
INTRAVENOUS | Status: DC | PRN
  Administered 2017-07-02: 60 mg via INTRAVENOUS

## 2017-07-02 MED ORDER — ONDANSETRON HCL 4 MG/2ML IJ SOLN
INTRAMUSCULAR | Status: AC
  Filled 2017-07-02: qty 2

## 2017-07-02 MED ORDER — MIDAZOLAM HCL 2 MG/2ML IJ SOLN
INTRAMUSCULAR | Status: AC
  Filled 2017-07-02: qty 2

## 2017-07-02 MED ORDER — BUPIVACAINE HCL (PF) 0.5 % IJ SOLN
INTRAMUSCULAR | Status: DC | PRN
  Administered 2017-07-02: 9 mL

## 2017-07-02 MED ORDER — IBUPROFEN 800 MG PO TABS: 800.0000 mg | ORAL_TABLET | Freq: Three times a day (TID) | ORAL | 1 refills | Status: DC

## 2017-07-02 MED ORDER — KETOROLAC TROMETHAMINE 30 MG/ML IJ SOLN
INTRAMUSCULAR | Status: DC | PRN
  Administered 2017-07-02: 30 mg via INTRAVENOUS

## 2017-07-02 MED ORDER — IBUPROFEN 800 MG PO TABS
800.0000 mg | ORAL_TABLET | Freq: Three times a day (TID) | ORAL | Status: DC
  Administered 2017-07-02: 800 mg via ORAL

## 2017-07-02 MED ORDER — FENTANYL CITRATE (PF) 100 MCG/2ML IJ SOLN
INTRAMUSCULAR | Status: AC
  Filled 2017-07-02: qty 2

## 2017-07-02 MED ORDER — LIDOCAINE HCL (PF) 2 % IJ SOLN
INTRAMUSCULAR | Status: AC
  Filled 2017-07-02: qty 10

## 2017-07-02 MED ORDER — FAMOTIDINE 20 MG PO TABS
ORAL_TABLET | ORAL | Status: AC
  Administered 2017-07-02: 20 mg via ORAL
  Filled 2017-07-02: qty 1

## 2017-07-02 MED ORDER — PROMETHAZINE HCL 25 MG/ML IJ SOLN: 6.2500 mg | INTRAMUSCULAR | Status: DC | PRN

## 2017-07-02 MED ORDER — ONDANSETRON HCL 4 MG/2ML IJ SOLN
INTRAMUSCULAR | Status: DC | PRN
  Administered 2017-07-02: 4 mg via INTRAVENOUS

## 2017-07-02 SURGICAL SUPPLY — 40 items
ANCHOR TIS RET SYS 235ML (MISCELLANEOUS) IMPLANT
BAG INFUSER PRESSURE 100CC (MISCELLANEOUS) ×4 IMPLANT
BLADE SURG SZ11 CARB STEEL (BLADE) ×4 IMPLANT
CANISTER SUCT 1200ML W/VALVE (MISCELLANEOUS) ×4 IMPLANT
CATH ROBINSON RED A/P 16FR (CATHETERS) ×8 IMPLANT
CHLORAPREP W/TINT 26ML (MISCELLANEOUS) ×4 IMPLANT
DERMABOND ADVANCED (GAUZE/BANDAGES/DRESSINGS)
DERMABOND ADVANCED .7 DNX12 (GAUZE/BANDAGES/DRESSINGS) IMPLANT
DRSG TEGADERM 2-3/8X2-3/4 SM (GAUZE/BANDAGES/DRESSINGS) ×12 IMPLANT
GLOVE BIO SURGEON STRL SZ8 (GLOVE) ×12 IMPLANT
GLOVE INDICATOR 8.0 STRL GRN (GLOVE) ×4 IMPLANT
GOWN STRL REUS W/ TWL LRG LVL3 (GOWN DISPOSABLE) ×6 IMPLANT
GOWN STRL REUS W/ TWL XL LVL3 (GOWN DISPOSABLE) ×4 IMPLANT
GOWN STRL REUS W/TWL LRG LVL3 (GOWN DISPOSABLE) ×6
GOWN STRL REUS W/TWL XL LVL3 (GOWN DISPOSABLE) ×4
IRRIGATION STRYKERFLOW (MISCELLANEOUS) IMPLANT
IRRIGATOR STRYKERFLOW (MISCELLANEOUS)
IV LACTATED RINGERS 1000ML (IV SOLUTION) ×8 IMPLANT
KIT PINK PAD W/HEAD ARE REST (MISCELLANEOUS) ×4
KIT PINK PAD W/HEAD ARM REST (MISCELLANEOUS) ×2 IMPLANT
KIT RM TURNOVER CYSTO AR (KITS) ×8 IMPLANT
LABEL OR SOLS (LABEL) ×4 IMPLANT
LIGASURE VESSEL 5MM BLUNT TIP (ELECTROSURGICAL) ×4 IMPLANT
NS IRRIG 1000ML POUR BTL (IV SOLUTION) ×4 IMPLANT
NS IRRIG 500ML POUR BTL (IV SOLUTION) ×4 IMPLANT
PACK DNC HYST (MISCELLANEOUS) ×4 IMPLANT
PACK GYN LAPAROSCOPIC (MISCELLANEOUS) ×4 IMPLANT
PAD OB MATERNITY 4.3X12.25 (PERSONAL CARE ITEMS) ×8 IMPLANT
PAD PREP 24X41 OB/GYN DISP (PERSONAL CARE ITEMS) ×8 IMPLANT
POUCH ENDO CATCH 10MM SPEC (MISCELLANEOUS) IMPLANT
SCISSORS METZENBAUM CVD 33 (INSTRUMENTS) IMPLANT
SHEARS HARMONIC ACE PLUS 36CM (ENDOMECHANICALS) IMPLANT
SLEEVE ENDOPATH XCEL 5M (ENDOMECHANICALS) IMPLANT
SUT MNCRL 4-0 (SUTURE) ×2
SUT MNCRL 4-0 27XMFL (SUTURE) ×2
SUT VIC AB 0 UR5 27 (SUTURE) ×4 IMPLANT
SUTURE MNCRL 4-0 27XMF (SUTURE) ×2 IMPLANT
TROCAR ENDO BLADELESS 11MM (ENDOMECHANICALS) IMPLANT
TROCAR XCEL NON-BLD 5MMX100MML (ENDOMECHANICALS) ×4 IMPLANT
TUBING INSUFFLATION (TUBING) ×4 IMPLANT

## 2017-07-02 NOTE — Anesthesia Procedure Notes (Signed)
Procedure Name: Intubation Date/Time: 07/02/2017 12:35 PM Performed by: Eben Burow, CRNA Pre-anesthesia Checklist: Patient identified, Emergency Drugs available, Suction available, Patient being monitored and Timeout performed Patient Re-evaluated:Patient Re-evaluated prior to induction Oxygen Delivery Method: Circle system utilized Preoxygenation: Pre-oxygenation with 100% oxygen Induction Type: IV induction Ventilation: Mask ventilation without difficulty Laryngoscope Size: Miller and 2 Grade View: Grade I Tube type: Oral Tube size: 7.0 mm Number of attempts: 1 Airway Equipment and Method: Stylet and LTA kit utilized Placement Confirmation: ETT inserted through vocal cords under direct vision,  positive ETCO2 and breath sounds checked- equal and bilateral Secured at: 21 cm Tube secured with: Tape Dental Injury: Teeth and Oropharynx as per pre-operative assessment

## 2017-07-02 NOTE — Op Note (Addendum)
OPERATIVE NOTE:  Ashley Hampton PROCEDURE DATE: 07/02/2017   PREOPERATIVE DIAGNOSIS: 1.  Thickened endometrium on ultrasound 2.  Lower pelvic pain 3.  History of breast cancer; on tamoxifen therapy  POSTOPERATIVE DIAGNOSIS: Same as above and pelvic adhesions PROCEDURE: 1.  Laparoscopy with BSO and adhesio lysis 2.  Hysteroscopy/D&C  SURGEON:  Brayton Mars, MD ASSISTANTS: Jeannie Fend, MD ANESTHESIA: General INDICATIONS: 61 y.o. G32P1001 female, menopausal, on tamoxifen therapy for history of breast cancer, presents for evaluation of lower pelvic pain and thickened endometrium on pelvic ultrasound; patient does not have any postmenopausal bleeding or vaginal discharge  FINDINGS: Normal-appearing ovaries and tubes; normal-appearing uterus; right upper quadrant with bowel/abdominal wall adhesions which were lysed.   I/O's: Total I/O In: -  Out: 300 [Urine:300]; blood loss less than 10 cc COUNTS:  YES SPECIMENS: 1.  Bilateral tubes and ovaries 2.  Endometrial curettings ANTIBIOTIC PROPHYLAXIS:N/A COMPLICATIONS: None immediate  PROCEDURE IN DETAIL: Patient is brought into the operating room and placed in the supine position.  General endotracheal anesthesia was induced without difficulty.  She is placed in the dorsal lithotomy position using the bumblebee stirrups.  A ChloraPrep and Betadine abdominal perineal and intravaginal prep and drape is performed in standard fashion. Timeout is completed. Pelvic exam is completed; speculum was placed into the vagina and a Hulka tenaculum was placed onto the cervix to facilitate uterine manipulation; the uterus is sounded to 9 cm. LAPAROSCOPY: Laparoscopy is performed in standard fashion.  Subumbilical vertical incision is made 5 mm in length.  The Optiview laparoscopic trocar system is placed directly into the abdominal pelvic cavity without evidence of bowel or vascular injury.  An 11 mm port is placed in the right lower quadrant under  direct visualization.  A 5 mm port is placed in the left lower quadrant under direct visualization.  The above-noted findings were photo documented.  The laparoscopic BSO was performed in standard fashion using the Ace harmonic scalpel and graspers.  The infundibulopelvic ligament is clamped coagulated and cut; the mesosalpinx was clamped coagulated and cut the.  The utero-ovarian ligament is clamped coagulated and cut.  This is performed on the contralateral side as well.  Both fallopian tubes and ovaries are removed through the 11 mm port intact.  Kleppinger bipolar forceps is used to cauterize the mesosalpinx to optimize hemostasis.  The right upper quadrant bowel abdominal wall adhesions are taken down with the Ace harmonic scalpel.  No significant bleeding is encountered.  Upon completion of the abdominal portion of the procedure, the 11 mm port is closed using the Carter-Thomason suture LigaSure device.  0 Vicryl suture is placed and closed the fascia.  The pneumoperitoneum is released and other instruments are removed from the cavity.  The skin incisions are closed with 4-0 Monocryl suture.  Dermabond glue was placed over the incisions.  Telfa and Tegaderm dressings are placed on the incisions.  11 mm and two 5 mm incisions are infiltrated with 0.5% Sensorcaine without epinephrine.  This concludes the abdominal portion of the procedure. HYSTEROSCOPY/D&C: the hysteroscopy D&C is then performed in standard fashion.  Weighted speculum was placed in the vagina.  The Hulka tenaculum was removed and the single tooth tenaculum was placed on the anterior lip of the cervix.  The endocervical canal was dilated with Hanks dilators.  TheMyosure hysteroscope is used with lactated Ringer's as irrigant to assess the intrauterine cavity.  No obvious abnormalities are identified.  Curettage was performed with both smooth and serrated curettes with  production of minimal tissue.  Stone polyp forceps are used to remove residual  tissue which is minimal.  This tissue was sent to pathology.  Repeat hysteroscopy demonstrated a good sampling of the endometrial cavity.  Procedure was then terminated with all instrumentation being removed from the vagina.  The patient is awakened mobilized and taken to recovery room in satisfactory condition.   Blanch Stang A. Zipporah Plants, MD, ACOG [facility]

## 2017-07-02 NOTE — Discharge Instructions (Signed)

## 2017-07-02 NOTE — Transfer of Care (Signed)
Immediate Anesthesia Transfer of Care Note  Patient: Ashley Hampton  Procedure(s) Performed: DILATATION AND CURETTAGE /HYSTEROSCOPY (N/A ) LAPAROSCOPIC BILATERAL SALPINGO OOPHORECTOMY (Bilateral )  Patient Location: PACU  Anesthesia Type:General  Level of Consciousness: awake, alert  and patient cooperative  Airway & Oxygen Therapy: Patient Spontanous Breathing and Patient connected to face mask oxygen  Post-op Assessment: Report given to RN and Post -op Vital signs reviewed and stable  Post vital signs: Reviewed and stable  Last Vitals:  Vitals:   07/02/17 0911 07/02/17 0912  BP: 105/68 (!) 107/56  Pulse: 75   Resp:    Temp:    SpO2:      Last Pain:  Vitals:   07/02/17 0852  TempSrc: Tympanic         Complications: No apparent anesthesia complications

## 2017-07-02 NOTE — OR Nursing (Signed)
Patient's BP was 70/35 initially when sitting on bench. Patient denied feeling faint or light headed. She reports she always runs low but that is a little more low. Orthostatic BP checked after that with no difference.

## 2017-07-02 NOTE — Anesthesia Post-op Follow-up Note (Signed)
Anesthesia QCDR form completed.        

## 2017-07-02 NOTE — Interval H&P Note (Signed)
History and Physical Interval Note:  07/02/2017 11:07 AM  Ashley Hampton  has presented today for surgery, with the diagnosis of PELVIC PAIN, THICKENED ENDOMETRIUM  The various methods of treatment have been discussed with the patient and family. After consideration of risks, benefits and other options for treatment, the patient has consented to  Procedure(s): DILATATION AND CURETTAGE /HYSTEROSCOPY (N/A) LAPAROSCOPIC BILATERAL SALPINGO OOPHORECTOMY (Bilateral) as a surgical intervention .  The patient's history has been reviewed, patient examined, no change in status, stable for surgery.  I have reviewed the patient's chart and labs.  Questions were answered to the patient's satisfaction.     Hassell Done [name]

## 2017-07-02 NOTE — Anesthesia Preprocedure Evaluation (Signed)
Anesthesia Evaluation  Patient identified by MRN, date of birth, ID band Patient awake    Reviewed: Allergy & Precautions, H&P , NPO status , Patient's Chart, lab work & pertinent test results, reviewed documented beta blocker date and time   History of Anesthesia Complications (+) PONV and history of anesthetic complications  Airway Mallampati: I  TM Distance: >3 FB Neck ROM: full    Dental  (+) Caps, Dental Advidsory Given, Teeth Intact Permanent retainer on the bottom:   Pulmonary neg shortness of breath, neg sleep apnea, neg COPD, Recent URI ,           Cardiovascular Exercise Tolerance: Good negative cardio ROS       Neuro/Psych negative neurological ROS  negative psych ROS   GI/Hepatic negative GI ROS, Neg liver ROS,   Endo/Other  neg diabetesHypothyroidism   Renal/GU negative Renal ROS  negative genitourinary   Musculoskeletal   Abdominal   Peds  Hematology negative hematology ROS (+)   Anesthesia Other Findings Past Medical History: No date: AR (allergic rhinitis) No date: Breast cancer ([place])     Comment:  h/o- infiltrating ductal carcinoma No date: DJD (degenerative joint disease) No date: DJD (degenerative joint disease) No date: Hypercholesteremia No date: Hypoglycemia No date: Hypothyroid No date: Menopausal state No date: Multinodular goiter No date: Osteopenia No date: PONV (postoperative nausea and vomiting)     Comment:  after lumpectomy.  Has been pre-medicated since then No date: Seasonal allergies No date: Severe dysplasia of cervix (CIN III) 05/20/2015: SUI (stress urinary incontinence, female) No date: Vaginal atrophy No date: Vitamin D deficiency   Reproductive/Obstetrics negative OB ROS                             Anesthesia Physical Anesthesia Plan  ASA: II  Anesthesia Plan: General   Post-op Pain Management:    Induction: Intravenous  PONV  Risk Score and Plan: 4 or greater and Ondansetron, Dexamethasone and Promethazine  Airway Management Planned: Oral ETT  Additional Equipment:   Intra-op Plan:   Post-operative Plan: Extubation in OR  Informed Consent: I have reviewed the patients History and Physical, chart, labs and discussed the procedure including the risks, benefits and alternatives for the proposed anesthesia with the patient or authorized representative who has indicated his/her understanding and acceptance.   Dental Advisory Given  Plan Discussed with: Anesthesiologist, CRNA and Surgeon  Anesthesia Plan Comments:         Anesthesia Quick Evaluation

## 2017-07-03 ENCOUNTER — Other Ambulatory Visit: Payer: Self-pay

## 2017-07-03 ENCOUNTER — Encounter: Payer: Self-pay | Admitting: Obstetrics and Gynecology

## 2017-07-04 LAB — SURGICAL PATHOLOGY

## 2017-07-04 NOTE — Anesthesia Postprocedure Evaluation (Signed)
Anesthesia Post Note  Patient: Ashley Hampton  Procedure(s) Performed: DILATATION AND CURETTAGE /HYSTEROSCOPY (N/A ) LAPAROSCOPIC BILATERAL SALPINGO OOPHORECTOMY (Bilateral )  Patient location during evaluation: PACU Anesthesia Type: General Level of consciousness: awake and alert Pain management: pain level controlled Vital Signs Assessment: post-procedure vital signs reviewed and stable Respiratory status: spontaneous breathing, nonlabored ventilation, respiratory function stable and patient connected to nasal cannula oxygen Cardiovascular status: blood pressure returned to baseline and stable Postop Assessment: no apparent nausea or vomiting Anesthetic complications: no     Last Vitals:  Vitals:   07/02/17 1439 07/02/17 1515  BP: 115/62 (!) 118/57  Pulse: 68 (!) 58  Resp: 16 16  Temp: (!) 36.3 C   SpO2: 100% 99%    Last Pain:  Vitals:   07/02/17 1545  TempSrc:   PainSc: 2                  Martha Clan

## 2017-07-10 ENCOUNTER — Encounter: Payer: Self-pay | Admitting: Obstetrics and Gynecology

## 2017-07-10 ENCOUNTER — Encounter: Payer: Managed Care, Other (non HMO) | Admitting: Obstetrics and Gynecology

## 2017-07-10 ENCOUNTER — Ambulatory Visit (INDEPENDENT_AMBULATORY_CARE_PROVIDER_SITE_OTHER): Payer: Managed Care, Other (non HMO) | Admitting: Obstetrics and Gynecology

## 2017-07-10 VITALS — BP 96/62 | HR 85 | Ht 66.0 in | Wt 148.9 lb

## 2017-07-10 DIAGNOSIS — Z853 Personal history of malignant neoplasm of breast: Secondary | ICD-10-CM

## 2017-07-10 DIAGNOSIS — Z09 Encounter for follow-up examination after completed treatment for conditions other than malignant neoplasm: Secondary | ICD-10-CM

## 2017-07-10 DIAGNOSIS — Z90722 Acquired absence of ovaries, bilateral: Secondary | ICD-10-CM

## 2017-07-10 DIAGNOSIS — Z9889 Other specified postprocedural states: Secondary | ICD-10-CM

## 2017-07-10 NOTE — Progress Notes (Signed)
Chief complaint: 1.  1 week postop check 2.  Status post laparoscopic BSO 3.  Status post hysteroscopy/D&C for thickened endometrium 4.  History of breast cancer  Patient presents for her 1 week postop check.  She is doing reasonably well with normal bowel and bladder function.  She does have vague lower abdominal discomfort.  No fevers chills or sweats.  She does report her subumbilical incision to be slightly more tender than the other laparoscopy port sites.  Pathology from surgery: DIAGNOSIS:  A. FALLOPIAN TUBES AND OVARIES, BILATERAL; SALPINGO-OOPHORECTOMY:  - OVARIES WITH STROMAL HYPERPLASIA.  - UNREMARKABLE FALLOPIAN TUBES.  - NEGATIVE FOR ATYPIA AND MALIGNANCY.   B. ENDOMETRIAL CURETTINGS:  - FRAGMENTS OF BENIGN SQUAMOUS MUCOSA.  - EXTREMELY RARE SCANT STRIPS OF UNREMARKABLE ENDOMETRIUM WITH ATROPHIC  CHANGE.  - NEGATIVE FOR ATYPIA AND MALIGNANCY.   OBJECTIVE: BP 96/62   Pulse 85   Ht 5\' 6"  (1.676 m)   Wt 148 lb 14.4 oz (67.5 kg)   BMI 24.03 kg/m  Pleasant female no acute distress.  Alert and oriented. Abdomen: Soft, nontender; laparoscopy port sites are well approximated with minimal Dermabond glue present; subumbilical incision slightly tender to palpation with minimal induration on left side of incision, doubt stitch abscess (but possible)  ASSESSMENT: 1.  Normal postop check 1 week status post laparoscopic BSO and hysteroscopy/D&C  PLAN: 1.  Patient may use Neosporin ointment on subumbilical incision as well as cleansing the incision with half-and-half hydrogen peroxide/normal saline 2.  Return in 4 weeks for follow-up  Brayton Mars, MD  Note: This dictation was prepared with Dragon dictation along with smaller phrase technology. Any transcriptional errors that result from this process are unintentional.

## 2017-07-10 NOTE — Patient Instructions (Signed)
1.  Return in 4 weeks for final postop check 2.  May apply Neosporin ointment to subumbilical incision; may clean the incision with half and half hydrogen peroxide/normal saline solution

## 2017-07-18 ENCOUNTER — Ambulatory Visit
Admission: RE | Admit: 2017-07-18 | Discharge: 2017-07-18 | Disposition: A | Discharge status: Home / Self Care | Payer: Managed Care, Other (non HMO) | Source: Ambulatory Visit | Attending: Physician Assistant | Admitting: Physician Assistant

## 2017-07-18 DIAGNOSIS — Z299 Encounter for prophylactic measures, unspecified: Secondary | ICD-10-CM | POA: Diagnosis not present

## 2017-07-18 DIAGNOSIS — M8588 Other specified disorders of bone density and structure, other site: Secondary | ICD-10-CM | POA: Diagnosis not present

## 2017-07-23 ENCOUNTER — Other Ambulatory Visit: Payer: Self-pay

## 2017-07-23 DIAGNOSIS — Z299 Encounter for prophylactic measures, unspecified: Secondary | ICD-10-CM

## 2017-07-23 MED ORDER — CYANOCOBALAMIN 1000 MCG/ML IJ SOLN
1000.0000 ug | Freq: Once | INTRAMUSCULAR | Status: AC
  Administered 2017-07-23: 1000 ug via INTRAMUSCULAR

## 2017-07-23 NOTE — Progress Notes (Signed)
Patient came in to have her scheduled b12 injection and blood drawn for testing.

## 2017-07-23 NOTE — Progress Notes (Deleted)
Patient ID: Ashley Hampton, female   DOB: [birth year], 61 y.o.   MRN: [id number] ANNUAL PREVENTATIVE CARE GYN  ENCOUNTER NOTE  Subjective:       Ashley Hampton is a 61 y.o. G14P1001 female here for a routine annual gynecologic exam.  Current complaints: 1.     Gynecologic History No LMP recorded. Patient is postmenopausal. Contraception: post menopausal status Last Pap:07/2015 neg/neg  07/25/2016 neg  Results were: wnl Last mammogram: 2017- [facility]. Results were: normal  Obstetric History OB History  Gravida Para Term Preterm AB Living  1 1 1     1   SAB TAB Ectopic Multiple Live Births          1    # Outcome Date GA Lbr Len/2nd Weight Sex Delivery Anes PTL Lv  1 Term      Vag-Spont   LIV      Past Medical History:  Diagnosis Date  . AR (allergic rhinitis)   . Breast cancer ([place])    h/o- infiltrating ductal carcinoma  . DJD (degenerative joint disease)   . DJD (degenerative joint disease)   . Hypercholesteremia   . Hypoglycemia   . Hypothyroid   . Menopausal state   . Multinodular goiter   . Osteopenia   . PONV (postoperative nausea and vomiting)    after lumpectomy.  Has been pre-medicated since then  . Seasonal allergies   . Severe dysplasia of cervix (CIN III)   . SUI (stress urinary incontinence, female) 05/20/2015  . Vaginal atrophy   . Vitamin D deficiency     Past Surgical History:  Procedure Laterality Date  . BREAST LUMPECTOMY    . breast reconstruction Right 2010  . CERVICAL BIOPSY  W/ LOOP ELECTRODE EXCISION    . CERVICAL CONE BIOPSY    . CESAREAN SECTION    . ETHMOIDECTOMY Bilateral 09/02/2015   Procedure:  TOTAL ETHMOIDECTOMY;  Surgeon: Margaretha Sheffield, MD;  Location: [place];  Service: ENT;  Laterality: Bilateral;  . HYSTEROSCOPY W/D&C N/A 07/02/2017   Procedure: DILATATION AND CURETTAGE /HYSTEROSCOPY;  Surgeon: Defrancesco, Alanda Slim, MD;  Location: [facility];  Service: Gynecology;  Laterality: N/A;  . IMAGE GUIDED SINUS SURGERY N/A 09/02/2015    Procedure: IMAGE GUIDED SINUS SURGERY;  Surgeon: Margaretha Sheffield, MD;  Location: [place];  Service: ENT;  Laterality: N/A;  gave disk to cece   . LAPAROSCOPIC BILATERAL SALPINGO OOPHERECTOMY Bilateral 07/02/2017   Procedure: LAPAROSCOPIC BILATERAL SALPINGO OOPHORECTOMY;  Surgeon: Brayton Mars, MD;  Location: [facility];  Service: Gynecology;  Laterality: Bilateral;  . MASTECTOMY Right 2010   total - infiltrating ductal carcinoma  . MAXILLARY ANTROSTOMY Bilateral 09/02/2015   Procedure: ENDOSCOPIC MAXILLARY ANTROSTOMY ;  Surgeon: Margaretha Sheffield, MD;  Location: [place];  Service: ENT;  Laterality: Bilateral;  . SEPTOPLASTY N/A 09/02/2015   Procedure: SEPTOPLASTY;  Surgeon: Margaretha Sheffield, MD;  Location: [place];  Service: ENT;  Laterality: N/A;    Current Outpatient Medications on File Prior to Visit  Medication Sig Dispense Refill  . CALCIUM PO Take 1 tablet daily by mouth.    . cetirizine (ZYRTEC) 10 MG tablet Take 10 mg daily by mouth.    . Cholecalciferol (VITAMIN D3 PO) Take 1 capsule daily by mouth.    . cyanocobalamin (,VITAMIN B-12,) 1000 MCG/ML injection INJECT 1 ML (1,000 MCG TOTAL) INTO THE MUSCLE EVERY 30 (THIRTY) DAYS. 1 mL 10  . docusate sodium (STOOL SOFTENER) 100 MG capsule Take 100 mg  daily by mouth.     . fluticasone (FLONASE) 50 MCG/ACT nasal spray Place 2 sprays into both nostrils daily. 16 g 12  . hydrOXYzine (ATARAX/VISTARIL) 10 MG tablet Take 1 tablet (10 mg total) by mouth 3 (three) times daily as needed. (Patient taking differently: Take 10 mg 3 (three) times daily as needed by mouth for itching. ) 30 tablet 6  . ibuprofen (ADVIL,MOTRIN) 800 MG tablet Take 1 tablet (800 mg total) by mouth 3 (three) times daily. 30 tablet 1  . levothyroxine (SYNTHROID, LEVOTHROID) 88 MCG tablet Take 1 tablet (88 mcg total) by mouth daily. 90 tablet 3  . montelukast (SINGULAIR) 10 MG tablet Take 10 mg daily by mouth.     . polyethylene glycol (MIRALAX /  GLYCOLAX) packet Take 17 g daily as needed by mouth for moderate constipation.    . pravastatin (PRAVACHOL) 20 MG tablet TAKE 1 TABLET BY MOUTH  DAILY (Patient taking differently: Take 20 mg by mouth daily) 90 tablet 3  . Probiotic Product (PROBIOTIC PO) Take 1 capsule daily by mouth.    . Pseudoephedrine HCl (SUDAFED PO) Take 1 tablet every 4 (four) hours as needed by mouth (for congestion).    . tamoxifen (NOLVADEX) 20 MG tablet Take 20 mg daily by mouth.      No current facility-administered medications on file prior to visit.     Allergies  Allergen Reactions  . Ampicillin Rash and Other (See Comments)    Has patient had a PCN reaction causing immediate rash, facial/tongue/throat swelling, SOB or lightheadedness with hypotension: Yes Has patient had a PCN reaction causing severe rash involving mucus membranes or skin necrosis: No Has patient had a PCN reaction that required hospitalization: No Has patient had a PCN reaction occurring within the last 10 years: No If all of the above answers are "NO", then may proceed with Cephalosporin use.     Social History   Socioeconomic History  . Marital status: Married    Spouse name: Not on file  . Number of children: Not on file  . Years of education: Not on file  . Highest education level: Not on file  Social Needs  . Financial resource strain: Not on file  . Food insecurity - worry: Not on file  . Food insecurity - inability: Not on file  . Transportation needs - medical: Not on file  . Transportation needs - non-medical: Not on file  Occupational History  . Not on file  Tobacco Use  . Smoking status: Never Smoker  . Smokeless tobacco: Never Used  Substance and Sexual Activity  . Alcohol use: Yes    Comment: occas 1 drink per month  . Drug use: No  . Sexual activity: Yes    Birth control/protection: Post-menopausal  Other Topics Concern  . Not on file  Social History Narrative  . Not on file    Family History  Problem  Relation Age of Onset  . Lung cancer Mother   . Lung cancer Father   . Ovarian cancer Neg Hx   . Breast cancer Neg Hx   . Cancer Neg Hx   . Diabetes Neg Hx   . Heart disease Neg Hx     The following portions of the patient's history were reviewed and updated as appropriate: allergies, current medications, past family history, past medical history, past social history, past surgical history and problem list.  Review of Systems ROS    Objective:   There were no vitals taken for  this visit. CONSTITUTIONAL: Well-developed, well-nourished female in no acute distress.  PSYCHIATRIC: Normal mood and affect. Normal behavior. Normal judgment and thought content. [place]: Alert and oriented to person, place, and time. Normal muscle tone coordination. No cranial nerve deficit noted. HENT:  Normocephalic, atraumatic, External right and left ear normal. Oropharynx is clear and moist EYES: Conjunctivae and EOM are normal. Pupils are equal, round, and reactive to light. No scleral icterus.  NECK: Normal range of motion, supple, no masses.  Normal thyroid.  SKIN: Skin is warm and dry. No rash noted. Not diaphoretic. No erythema. No pallor. CARDIOVASCULAR: Normal heart rate noted, regular rhythm, no murmur. RESPIRATORY: Clear to auscultation bilaterally. Effort and breath sounds normal, no problems with respiration noted. BREASTS: Right breast reconstruction changes; Nontender, no masses. No masses, skin changes, nipple drainage, or lymphadenopathy. ABDOMEN: Soft, normal bowel sounds, no distention noted.  No tenderness, rebound or guarding.  BLADDER: Normal PELVIC:  External Genitalia: Normal  BUS: Normal  Vagina: Moderate atrophy  Cervix: Scarred; no cervical motion tenderness ; friable after Pap smear  Uterus: Normal; Small, mobile, nontender , anteior  Adnexa: Normal; Nonpalpable, nontender  RV: External Exam NormaI  MUSCULOSKELETAL: Normal range of motion. No tenderness.  No cyanosis,  clubbing, or edema.  2+ distal pulses. LYMPHATIC: No Axillary, Supraclavicular, or Inguinal Adenopathy.    Assessment/Plan   Annual gynecologic examination 61 y.o. Contraception: post menopausal status bmi-23 Vaginal atrophy History of breast cancer; on tamoxifen History of CIN-3, status post excision;;Last year Pap smear n/n  Pap:due 2019 Mammogram: [facility] St[facility] Guaiac Testing:  [facility] Labs: thru pcp Routine preventative health maintenance measures emphasized: Exercise/Diet/Weight control, Tobacco Warnings and Alcohol/Substance use risks Lubricants when necessary Return to [place], CMA   Note: This dictation was prepared with Dragon dictation along with smaller phrase technology. Any transcriptional errors that result from this process are unintentional.   Note: This dictation was prepared with Dragon dictation along with smaller phrase technology. Any transcriptional errors that result from this process are unintentional.

## 2017-07-24 LAB — TSH: TSH: 0.721 u[IU]/mL (ref 0.450–4.500)

## 2017-07-24 NOTE — Progress Notes (Signed)
ok 

## 2017-07-26 ENCOUNTER — Encounter: Payer: Managed Care, Other (non HMO) | Admitting: Obstetrics and Gynecology

## 2017-08-02 ENCOUNTER — Telehealth: Payer: Self-pay | Admitting: Family

## 2017-08-02 NOTE — Telephone Encounter (Signed)
Bone density results reviewed. Advised to be sure she is taking her calcium and Vit D daily as directed and recommended wt bearing exercise.She should have repeat Bone Density in 2 yrear She has questions about her thyroid being in the low normal range and still not feeling well . I recommend referral to endocrine for further  evaluation .

## 2017-08-10 ENCOUNTER — Encounter: Payer: Self-pay | Admitting: Emergency Medicine

## 2017-08-10 ENCOUNTER — Ambulatory Visit: Payer: Self-pay | Admitting: Emergency Medicine

## 2017-08-10 VITALS — BP 100/60 | HR 95 | Temp 97.6°F | Resp 16

## 2017-08-10 DIAGNOSIS — B309 Viral conjunctivitis, unspecified: Secondary | ICD-10-CM

## 2017-08-10 LAB — POCT RAPID STREP A (OFFICE): RAPID STREP A SCREEN: NEGATIVE

## 2017-08-10 MED ORDER — OFLOXACIN 0.3 % OP SOLN: 1.0000 [drp] | Freq: Four times a day (QID) | OPHTHALMIC | 0 refills | Status: DC

## 2017-08-10 MED ORDER — BENZONATATE 100 MG PO CAPS: 100.0000 mg | ORAL_CAPSULE | Freq: Three times a day (TID) | ORAL | 0 refills | Status: DC | PRN

## 2017-08-10 MED ORDER — OFLOXACIN 0.3 % OP SOLN: 1.0000 [drp] | Freq: Four times a day (QID) | OPHTHALMIC | Status: DC

## 2017-08-10 NOTE — Addendum Note (Signed)
Addended by: Arlyss Queen A on: 08/10/2017 02:52 PM   Modules accepted: Orders

## 2017-08-10 NOTE — Addendum Note (Signed)
Addended by: Rudene Anda T on: 08/10/2017 04:13 PM   Modules accepted: Orders

## 2017-08-10 NOTE — Progress Notes (Signed)
Subjective. Patient enters with a one-week history of sore throat and discomfort in her left ear. She does feel congested. She takes Allegra-D and Flonase nasal spray. She has had a dry nonproductive cough. This morning when she woke up she developed a crusty drainage from her left eye. Review of systems  History of breast cancer in remission. Objective. Eyes. Pupils equal reactive to light. There is redness of the conjunctiva of the left eye. Nose is congested. Throat is normal without redness or exudate. Neck supple without adenopathy. Chest clear to auscultation. Assessment. Upper respiratory infection with viral conjunctivitis of the left eye. Plan. We'll treat with Ocuflox eyedrops. Tessalon Perles for cough. Continue current medications.

## 2017-08-24 ENCOUNTER — Other Ambulatory Visit: Payer: Self-pay

## 2017-08-24 DIAGNOSIS — E538 Deficiency of other specified B group vitamins: Secondary | ICD-10-CM

## 2017-08-24 MED ORDER — CYANOCOBALAMIN 1000 MCG/ML IJ SOLN
1000.0000 ug | Freq: Once | INTRAMUSCULAR | Status: AC
  Administered 2017-08-24: 1000 ug via INTRAMUSCULAR

## 2017-08-24 NOTE — Progress Notes (Signed)
Patient came in to have her scheduled Vitamin B12 injection.

## 2017-09-06 ENCOUNTER — Ambulatory Visit: Payer: Self-pay | Admitting: Medical

## 2017-09-06 ENCOUNTER — Encounter: Payer: Self-pay | Admitting: Medical

## 2017-09-06 VITALS — BP 112/76 | HR 109 | Temp 98.1°F

## 2017-09-06 DIAGNOSIS — J0100 Acute maxillary sinusitis, unspecified: Secondary | ICD-10-CM

## 2017-09-06 DIAGNOSIS — J069 Acute upper respiratory infection, unspecified: Secondary | ICD-10-CM

## 2017-09-06 DIAGNOSIS — R05 Cough: Secondary | ICD-10-CM

## 2017-09-06 DIAGNOSIS — R059 Cough, unspecified: Secondary | ICD-10-CM

## 2017-09-06 DIAGNOSIS — J301 Allergic rhinitis due to pollen: Secondary | ICD-10-CM

## 2017-09-06 MED ORDER — MONTELUKAST SODIUM 10 MG PO TABS: 10.0000 mg | ORAL_TABLET | Freq: Every day | ORAL | 3 refills | Status: AC

## 2017-09-06 MED ORDER — FLUTICASONE PROPIONATE 50 MCG/ACT NA SUSP: 2.0000 | Freq: Every day | NASAL | 12 refills | Status: DC

## 2017-09-06 MED ORDER — AZITHROMYCIN 250 MG PO TABS: ORAL_TABLET | ORAL | 0 refills | Status: DC

## 2017-09-06 NOTE — Progress Notes (Signed)
   Subjective:    Patient ID: Ashley Hampton, female    DOB: 08/22/1955, 62 y.o.   MRN: [id number]  HPI  62 yo female in non acute distress. Retired.Seen on  08/10/17 for URI and Left eye conjunctivitis.Feels eye has improved a lot, however now with facial pain and right sided teeth pain, Cough productive yellow and nasal discharge yellow green. Ear pressure bilaterally.Felt as if she had a fever and chills.  Headache on the forehead and sore throat on and off. Took Nyquil last night and Sudafed yesterday morning. Taking Tylenol for bodyaches.   Needs refill on medications : Flonase, Montelukast 10mg , Levothyroxine 88 mcg, Pravastatin 20mg  due to change in pharmacies.    Review of Systems  Constitutional: Positive for chills and fever.  HENT: Positive for congestion, sinus pressure, sinus pain and sore throat. Negative for ear pain.   Respiratory: Positive for cough and shortness of breath (with coughing alot).   Cardiovascular: Negative for chest pain.  Gastrointestinal: Positive for abdominal pain (soreness from coughing so much).  Genitourinary: Negative for dysuria.  Musculoskeletal: Positive for myalgias.  Skin: Negative for rash.  Allergic/Immunologic: Positive for environmental allergies.  Neurological: Negative for dizziness, syncope and light-headedness.  Hematological: Negative for adenopathy.  Psychiatric/Behavioral: Negative for behavioral problems, self-injury and suicidal ideas. The patient is not nervous/anxious.       Did not get cough medication. Didn't know about it and was not given it. Prescription looks like it went through. Objective:   Physical Exam  Constitutional: She is oriented to person, place, and time. She appears well-developed and well-nourished.  HENT:  Head: Normocephalic and atraumatic.  Right Ear: Hearing, external ear and ear canal normal. A middle ear effusion is present.  Left Ear: Hearing, external ear and ear canal normal. A middle ear effusion is  present.  Nose: Mucosal edema and rhinorrhea present.  Mouth/Throat: Uvula is midline and mucous membranes are normal. Posterior oropharyngeal erythema present.    Eyes: Conjunctivae and EOM are normal. Pupils are equal, round, and reactive to light.  Neck: Normal range of motion. Neck supple.  Cardiovascular: Normal rate, regular rhythm and normal heart sounds.  Pulmonary/Chest: Effort normal and breath sounds normal.  Lymphadenopathy:    She has no cervical adenopathy.  Neurological: She is alert and oriented to person, place, and time.  Skin: Skin is warm and dry.  Psychiatric: She has a normal mood and affect. Her behavior is normal. Judgment and thought content normal.  Nursing note and vitals reviewed.    cough noted in room Assessment & Plan:  Sinusitis/ Upper Respiratory Infection / Cough. Meds ordered this encounter  Medications  . azithromycin (ZITHROMAX) 250 MG tablet    Sig: Take 2 tablets by mouth today then one tablet days  2-5 , take with food.    Dispense:  6 tablet    Refill:  0  . fluticasone (FLONASE) 50 MCG/ACT nasal spray    Sig: Place 2 sprays into both nostrils daily.    Dispense:  16 g    Refill:  12  . montelukast (SINGULAIR) 10 MG tablet    Sig: Take 1 tablet (10 mg total) by mouth daily.    Dispense:  90 tablet    Refill:  3   Shannon CMA to call patient for lab appointment. Scheduled for Monday 09/10/2017. Patient will need lab work before she can have refills on Levothyroxine and Pravastatin. Patient verbalizes understanding and has no questions at discharge.

## 2017-09-10 ENCOUNTER — Other Ambulatory Visit: Payer: Self-pay

## 2017-09-10 DIAGNOSIS — Z299 Encounter for prophylactic measures, unspecified: Secondary | ICD-10-CM

## 2017-09-11 LAB — CMP12+LP+TP+TSH+6AC+CBC/D/PLT
ALK PHOS: 62 IU/L (ref 39–117)
ALT: 13 IU/L (ref 0–32)
AST: 14 IU/L (ref 0–40)
Albumin/Globulin Ratio: 1.7 (ref 1.2–2.2)
Albumin: 4 g/dL (ref 3.6–4.8)
BASOS: 1 %
BUN / CREAT RATIO: 18 (ref 12–28)
BUN: 16 mg/dL (ref 8–27)
Basophils Absolute: 0.1 10*3/uL (ref 0.0–0.2)
Bilirubin Total: 0.2 mg/dL (ref 0.0–1.2)
CHLORIDE: 104 mmol/L (ref 96–106)
CHOL/HDL RATIO: 3.2 ratio (ref 0.0–4.4)
CREATININE: 0.88 mg/dL (ref 0.57–1.00)
Calcium: 9.4 mg/dL (ref 8.7–10.3)
Cholesterol, Total: 152 mg/dL (ref 100–199)
EOS (ABSOLUTE): 0.2 10*3/uL (ref 0.0–0.4)
Eos: 3 %
Estimated CHD Risk: <0.5 times avg. (ref 0.0–1.0)
Free Thyroxine Index: 2.3 (ref 1.2–4.9)
GFR, EST AFRICAN AMERICAN: 81 mL/min/{1.73_m2} (ref >59)
GFR, EST NON AFRICAN AMERICAN: 70 mL/min/{1.73_m2} (ref >59)
GGT: 9 IU/L (ref 0–60)
GLOBULIN, TOTAL: 2.3 g/dL (ref 1.5–4.5)
GLUCOSE: 86 mg/dL (ref 65–99)
HDL: 47 mg/dL (ref >39)
HEMATOCRIT: 37.2 % (ref 34.0–46.6)
Hemoglobin: 12.8 g/dL (ref 11.1–15.9)
Immature Grans (Abs): 0 10*3/uL (ref 0.0–0.1)
Immature Granulocytes: 0 %
Iron: 90 ug/dL (ref 27–139)
LDH: 167 IU/L (ref 119–226)
LDL CALC: 69 mg/dL (ref 0–99)
LYMPHS: 41 %
Lymphocytes Absolute: 2.7 10*3/uL (ref 0.7–3.1)
MCH: 30.3 pg (ref 26.6–33.0)
MCHC: 34.4 g/dL (ref 31.5–35.7)
MCV: 88 fL (ref 79–97)
Monocytes Absolute: 0.5 10*3/uL (ref 0.1–0.9)
Monocytes: 7 %
NEUTROS ABS: 3.2 10*3/uL (ref 1.4–7.0)
Neutrophils: 48 %
PHOSPHORUS: 3.8 mg/dL (ref 2.5–4.5)
POTASSIUM: 4.1 mmol/L (ref 3.5–5.2)
Platelets: 297 10*3/uL (ref 150–379)
RBC: 4.22 x10E6/uL (ref 3.77–5.28)
RDW: 14.2 % (ref 12.3–15.4)
SODIUM: 142 mmol/L (ref 134–144)
T3 Uptake Ratio: 21 % — ABNORMAL LOW (ref 24–39)
T4 TOTAL: 11.1 ug/dL (ref 4.5–12.0)
TRIGLYCERIDES: 180 mg/dL — AB (ref 0–149)
TSH: 0.968 u[IU]/mL (ref 0.450–4.500)
Total Protein: 6.3 g/dL (ref 6.0–8.5)
URIC ACID: 5.2 mg/dL (ref 2.5–7.1)
VLDL Cholesterol Cal: 36 mg/dL (ref 5–40)
WBC: 6.6 10*3/uL (ref 3.4–10.8)

## 2017-09-11 LAB — VITAMIN B12: VITAMIN B 12: 1361 pg/mL — AB (ref 232–1245)

## 2017-09-24 ENCOUNTER — Ambulatory Visit: Payer: Self-pay

## 2017-09-24 DIAGNOSIS — E538 Deficiency of other specified B group vitamins: Secondary | ICD-10-CM

## 2017-09-24 MED ORDER — CYANOCOBALAMIN 1000 MCG/ML IJ SOLN: 1000.0000 ug | INTRAMUSCULAR | 10 refills | Status: DC

## 2017-10-24 ENCOUNTER — Other Ambulatory Visit: Payer: Self-pay

## 2017-10-24 DIAGNOSIS — E538 Deficiency of other specified B group vitamins: Secondary | ICD-10-CM

## 2017-10-24 MED ORDER — CYANOCOBALAMIN 1000 MCG/ML IJ SOLN: 1000.0000 ug | Freq: Once | INTRAMUSCULAR | Status: DC

## 2017-10-24 NOTE — Progress Notes (Signed)
Gave B12 injection in LD.   Lot: [id number] Exp: OCT/2020 NDC: 99872-158-72

## 2017-10-31 ENCOUNTER — Encounter: Payer: Self-pay | Admitting: Family Medicine

## 2017-10-31 ENCOUNTER — Ambulatory Visit: Payer: Self-pay

## 2017-10-31 ENCOUNTER — Ambulatory Visit: Payer: Self-pay | Admitting: Family Medicine

## 2017-10-31 VITALS — BP 118/63 | HR 94 | Temp 97.7°F | Resp 20

## 2017-10-31 DIAGNOSIS — J028 Acute pharyngitis due to other specified organisms: Principal | ICD-10-CM

## 2017-10-31 DIAGNOSIS — B9789 Other viral agents as the cause of diseases classified elsewhere: Secondary | ICD-10-CM

## 2017-10-31 LAB — POCT RAPID STREP A (OFFICE): RAPID STREP A SCREEN: NEGATIVE

## 2017-10-31 NOTE — Progress Notes (Signed)
Subjective: Cough     Ashley Hampton is a 62 y.o. female who presents for evaluation of cough, sore throat, "white patches", hoarseness, and clear nasal discharge since Saturday.  Patient reports going to the [place] this past weekend.  Patient has been around her grandson who has a sore throat and rash and is being tested for strep throat.  Reports her and her grandson are both up-to-date on recommended immunizations.  Patient reports her sore throat is almost completely resolved.  Improving cough with only a small amount of green sputum.  Small white specks to the inside of her lips that she can brush off with her toothbrush.  No pain or symptoms related to this.  No underlying erythema or edema.  Denies oral ulcers.  Denies chewing tobacco, smoking, recent dental work, dentures, or any source of oral irritation or inflammation.  No other oral lesions or symptoms.  Denies any history of this in the past.  Denies rash, nausea, vomiting, diarrhea, shortness of breath, wheezing, chest or back pain, ear pain, difficulty swallowing, confusion, purulent nasal discharge, dental pain, facial pressure, headache, body aches, fatigue, fever, chills, or worsening of symptoms.  Reports symptoms as mild. Treatment to date: Expectorant/cough suppressant OTC and salt water gargle.  History of smoking, asthma, COPD: Negative History of recurrent sinus and/or lung infections: 3-4 sinus infections a year, symptoms different than her current symptoms.  Negative for frequent lung infections. Medical history: Breast cancer in the past, has been on tamoxifen for 9 years.  Hypothyroidism, hyperlipidemia, allergic rhinitis.  Patient had measles as a child. Antibiotic use in the last month: Negative.   Review of Systems Pertinent items noted in HPI and remainder of comprehensive ROS otherwise negative.     Objective:   Physical Exam General: Awake, alert, and oriented. No acute distress. Well developed, hydrated and nourished.  Appears stated age. Nontoxic appearance.  HEENT:  PND noted.  No erythema to posterior oropharynx.  No edema or exudates of pharynx or tonsils. No erythema or bulging of TM.  Mild erythema/edema to nasal mucosa. Sinuses nontender. Supple neck without adenopathy. Cardiac: Heart rate and rhythm are normal. No murmurs, gallops, or rubs are auscultated. S1 and S2 are heard and are of normal intensity.  Respiratory: No signs of respiratory distress. Lungs clear. No tachypnea. Able to speak in full sentences without dyspnea. Nonlabored respirations.  Skin: Skin is warm, dry and intact. Appropriate color for ethnicity. No cyanosis noted.   Diagnostic Results: Rapid strep negative.  Assessment:    viral upper respiratory illness   Plan:    Discussed diagnosis and treatment of URI. Discussed the importance of avoiding unnecessary antibiotic therapy. Suggested symptomatic OTC remedies.   Discussed suppressing cough only at night to sleep. Follow-up in 2 days to reassess.  Follow-up with primary care if needed. Discussed oral hygiene. Discussed red flag symptoms and circumstances with which to seek medical care.

## 2017-10-31 NOTE — Progress Notes (Signed)
Ashley Hampton, Ashley Hampton

## 2017-11-01 ENCOUNTER — Ambulatory Visit: Payer: Self-pay

## 2017-11-02 ENCOUNTER — Ambulatory Visit: Payer: Self-pay

## 2017-11-23 ENCOUNTER — Ambulatory Visit: Payer: Self-pay | Admitting: Family Medicine

## 2017-11-23 VITALS — BP 103/49 | HR 84 | Temp 97.8°F | Resp 20

## 2017-11-23 DIAGNOSIS — E538 Deficiency of other specified B group vitamins: Secondary | ICD-10-CM

## 2017-11-23 MED ORDER — CYANOCOBALAMIN 1000 MCG/ML IJ SOLN: 1000.0000 ug | Freq: Once | INTRAMUSCULAR | Status: DC

## 2017-11-23 NOTE — Progress Notes (Signed)
Gave b12 injection into LD Lot: [id number] Exp: Nov/2020

## 2017-11-23 NOTE — Progress Notes (Signed)
Subjective: Congestion    10/31/17: Ashley Hampton is a 63 y.o. female who presents for evaluation of cough, sore throat, "white patches", hoarseness, and clear nasal discharge since Saturday.  Patient reports going to the [place] this past weekend.  Patient has been around her grandson who has a sore throat and rash and is being tested for strep throat.  Reports her and her grandson are both up-to-date on recommended immunizations.  Patient reports her sore throat is almost completely resolved.  Improving cough with only a small amount of green sputum.  Small white specks to the inside of her lips that she can brush off with her toothbrush.  No pain or symptoms related to this.  No underlying erythema or edema.  Denies oral ulcers.  Denies chewing tobacco, smoking, recent dental work, dentures, or any source of oral irritation or inflammation.  No other oral lesions or symptoms.  Denies any history of this in the past.  Denies rash, nausea, vomiting, diarrhea, shortness of breath, wheezing, chest or back pain, ear pain, difficulty swallowing, confusion, purulent nasal discharge, dental pain, facial pressure, headache, body aches, fatigue, fever, chills, or worsening of symptoms.  Reports symptoms as mild. Treatment to date: Expectorant/cough suppressant OTC and salt water gargle.  History of smoking, asthma, COPD: Negative History of recurrent sinus and/or lung infections: 3-4 sinus infections a year, symptoms different than her current symptoms.  Negative for frequent lung infections. Medical history: Breast cancer in the past, has been on tamoxifen for 9 years.  Hypothyroidism, hyperlipidemia, allergic rhinitis.  Patient had measles as a child. Antibiotic use in the last month: Negative.   11/23/17: Patient reports complete resolution of all of her symptoms, with the exception of mild hoarseness and mild nasal congestion. Reports nasal congestion continues to improve.  Patient was treated symptomatically for  a viral URI that began a little less than 4 weeks ago.  On the day the patient was originally seen in office for this she had reported white patches in her mouth in the morning but this resolved the following day.  Patient denies any new or worsening of her symptoms.  Denies fever, chills, sore throat, difficulty swallowing, painful swallowing, facial pressure, body aches, headache, fatigue, malaise, cough, shortness of breath, chest or back pain.  Patient has a history of allergic rhinitis, which she reports is well controlled with Singulair and Zyrtec.  Patient has seen ENT in the past for this.  Denies a history of GERD.    Review of Systems Pertinent items noted in HPI and remainder of comprehensive ROS otherwise negative.     Objective:   Physical Exam General: Awake, alert, and oriented. No acute distress. Well developed, hydrated and nourished. Appears stated age. Nontoxic appearance.  HEENT: No PND noted.  No erythema to posterior oropharynx.  No edema or exudates of pharynx or tonsils. No erythema or bulging of TM.  Pale/boggy nasal mucosa. Sinuses nontender. Supple neck without adenopathy. Cardiac: Heart rate and rhythm are normal. No murmurs, gallops, or rubs are auscultated. S1 and S2 are heard and are of normal intensity.  Respiratory: No signs of respiratory distress. Lungs clear. No tachypnea. Able to speak in full sentences without dyspnea. Nonlabored respirations.  Skin: Skin is warm, dry and intact. Appropriate color for ethnicity. No cyanosis noted.   Assessment:    Laryngitis  Plan:   Advised patient that I would like to refer her to ENT.  Patient reports she already has a ENT that she will call  today to schedule an appointment for follow-up within the next week. Advised voice rest. Suggested symptomatic OTC remedies.   Follow-up with primary care provider as needed. Discussed red flag symptoms and circumstances with which to seek medical care.   Patient had an order for  a vitamin B12 injection from her primary care provider, which she received today.  Larene Beach.

## 2017-12-25 ENCOUNTER — Other Ambulatory Visit: Payer: Self-pay

## 2017-12-25 DIAGNOSIS — E538 Deficiency of other specified B group vitamins: Secondary | ICD-10-CM

## 2017-12-25 MED ORDER — CYANOCOBALAMIN 1000 MCG/ML IJ SOLN
1000.0000 ug | Freq: Once | INTRAMUSCULAR | Status: AC
  Administered 2017-12-25: 1000 ug via INTRAMUSCULAR

## 2018-01-28 ENCOUNTER — Other Ambulatory Visit: Payer: Self-pay

## 2018-01-28 DIAGNOSIS — R748 Abnormal levels of other serum enzymes: Secondary | ICD-10-CM

## 2018-01-28 MED ORDER — CYANOCOBALAMIN 1000 MCG/ML IJ SOLN
1000.0000 ug | Freq: Once | INTRAMUSCULAR | Status: AC
  Administered 2018-01-28: 1000 ug via INTRAMUSCULAR

## 2018-02-28 ENCOUNTER — Other Ambulatory Visit: Payer: Self-pay

## 2018-02-28 DIAGNOSIS — E538 Deficiency of other specified B group vitamins: Secondary | ICD-10-CM

## 2018-02-28 MED ORDER — CYANOCOBALAMIN 1000 MCG/ML IJ SOLN
1000.0000 ug | Freq: Once | INTRAMUSCULAR | Status: AC
  Administered 2018-02-28: 1000 ug via INTRAMUSCULAR

## 2018-04-01 ENCOUNTER — Other Ambulatory Visit: Payer: Self-pay

## 2018-04-01 DIAGNOSIS — E538 Deficiency of other specified B group vitamins: Secondary | ICD-10-CM

## 2018-04-01 MED ORDER — CYANOCOBALAMIN 1000 MCG/ML IJ SOLN
1000.0000 ug | Freq: Once | INTRAMUSCULAR | Status: AC
  Administered 2018-04-01: 1000 ug via INTRAMUSCULAR

## 2018-04-08 ENCOUNTER — Emergency Department: Payer: Managed Care, Other (non HMO)

## 2018-04-08 ENCOUNTER — Encounter: Payer: Self-pay | Admitting: Emergency Medicine

## 2018-04-08 ENCOUNTER — Emergency Department
Admission: EM | Admit: 2018-04-08 | Discharge: 2018-04-08 | Disposition: A | Discharge status: Home / Self Care | Payer: Managed Care, Other (non HMO) | Attending: Emergency Medicine | Admitting: Emergency Medicine

## 2018-04-08 ENCOUNTER — Other Ambulatory Visit: Payer: Self-pay

## 2018-04-08 DIAGNOSIS — Y939 Activity, unspecified: Secondary | ICD-10-CM | POA: Diagnosis not present

## 2018-04-08 DIAGNOSIS — Z79899 Other long term (current) drug therapy: Secondary | ICD-10-CM | POA: Insufficient documentation

## 2018-04-08 DIAGNOSIS — S59911A Unspecified injury of right forearm, initial encounter: Secondary | ICD-10-CM | POA: Diagnosis present

## 2018-04-08 DIAGNOSIS — Y929 Unspecified place or not applicable: Secondary | ICD-10-CM | POA: Diagnosis not present

## 2018-04-08 DIAGNOSIS — S52501A Unspecified fracture of the lower end of right radius, initial encounter for closed fracture: Secondary | ICD-10-CM

## 2018-04-08 DIAGNOSIS — Y999 Unspecified external cause status: Secondary | ICD-10-CM | POA: Diagnosis not present

## 2018-04-08 DIAGNOSIS — W07XXXA Fall from chair, initial encounter: Secondary | ICD-10-CM | POA: Insufficient documentation

## 2018-04-08 DIAGNOSIS — S52531A Colles' fracture of right radius, initial encounter for closed fracture: Secondary | ICD-10-CM | POA: Diagnosis not present

## 2018-04-08 MED ORDER — OXYCODONE-ACETAMINOPHEN 5-325 MG PO TABS
2.0000 | ORAL_TABLET | Freq: Once | ORAL | Status: AC
  Administered 2018-04-08: 2 via ORAL
  Filled 2018-04-08: qty 2

## 2018-04-08 MED ORDER — FENTANYL CITRATE (PF) 100 MCG/2ML IJ SOLN
50.0000 ug | Freq: Once | INTRAMUSCULAR | Status: AC
  Administered 2018-04-08: 50 ug via INTRAVENOUS
  Filled 2018-04-08: qty 2

## 2018-04-08 MED ORDER — ETOMIDATE 2 MG/ML IV SOLN
INTRAVENOUS | Status: AC | PRN
  Administered 2018-04-08: 10 mg via INTRAVENOUS

## 2018-04-08 MED ORDER — SODIUM CHLORIDE 0.9 % IV BOLUS
1000.0000 mL | Freq: Once | INTRAVENOUS | Status: AC
  Administered 2018-04-08: 1000 mL via INTRAVENOUS

## 2018-04-08 MED ORDER — OXYCODONE HCL 5 MG PO TABS: 5.0000 mg | ORAL_TABLET | Freq: Three times a day (TID) | ORAL | 0 refills | Status: DC | PRN

## 2018-04-08 MED ORDER — ETOMIDATE 2 MG/ML IV SOLN
0.3000 mg/kg | Freq: Once | INTRAVENOUS | Status: DC
  Filled 2018-04-08: qty 10

## 2018-04-08 MED ORDER — ONDANSETRON 4 MG PO TBDP: 4.0000 mg | ORAL_TABLET | Freq: Three times a day (TID) | ORAL | 0 refills | Status: DC | PRN

## 2018-04-08 MED ORDER — ONDANSETRON HCL 4 MG/2ML IJ SOLN
4.0000 mg | Freq: Once | INTRAMUSCULAR | Status: AC
  Administered 2018-04-08: 4 mg via INTRAVENOUS
  Filled 2018-04-08: qty 2

## 2018-04-08 MED ORDER — HYDROMORPHONE HCL 1 MG/ML IJ SOLN
1.0000 mg | Freq: Once | INTRAMUSCULAR | Status: AC
  Administered 2018-04-08: 1 mg via INTRAMUSCULAR
  Filled 2018-04-08: qty 1

## 2018-04-08 NOTE — ED Triage Notes (Signed)
Fell off stool just prior to arrival, R wrist pain. Wrist noted deformed, nailbeds pink with 2 sec CFT. Patient splinting with other hand.

## 2018-04-08 NOTE — ED Provider Notes (Signed)
[facility] Emergency Department Provider Note   ____________________________________________   First MD Initiated Contact with Patient 04/08/18 1313     (approximate)  I have reviewed the triage vital signs and the nursing notes.   HISTORY  Chief Complaint Wrist Pain    HPI Ashley Hampton is a 62 y.o. female patient complain of right wrist pain secondary fall off a stool prior to arrival.  Patient rates pain as a 10/10.  Patient described the pain is "sharp/achy".  No palliative measures for complaint.  Patient is right-hand dominant.  Past Medical History:  Diagnosis Date  . AR (allergic rhinitis)   . Breast cancer ([place])    h/o- infiltrating ductal carcinoma  . DJD (degenerative joint disease)   . DJD (degenerative joint disease)   . Hypercholesteremia   . Hypoglycemia   . Hypothyroid   . Menopausal state   . Multinodular goiter   . Osteopenia   . PONV (postoperative nausea and vomiting)    after lumpectomy.  Has been pre-medicated since then  . Seasonal allergies   . Severe dysplasia of cervix (CIN III)   . SUI (stress urinary incontinence, female) 05/20/2015  . Vaginal atrophy   . Vitamin D deficiency     Patient Active Problem List   Diagnosis Date Noted  . Status post laparoscopic bilateral salpingo-oophorectomy (BSO) 07/02/2017  . Rectocele 05/16/2017  . Cystocele, midline 05/16/2017  . Vaginal odor 05/16/2017  . Pelvic pain in female 05/16/2017  . S/P D&C (status post dilation and curettage)/hysteroscopy 07/25/2016  . History of breast cancer in female 07/25/2016  . Breast cancer ([place]) 05/20/2015  . Menopause 05/20/2015  . Vaginal atrophy 05/20/2015  . SUI (stress urinary incontinence, female) 05/20/2015  . Osteopenia 05/20/2015  . History of cervical dysplasia 05/20/2015  . Malignant neoplasm of breast ([place]) 04/30/2012    Past Surgical History:  Procedure Laterality Date  . BREAST LUMPECTOMY    . breast reconstruction  Right 2010  . CERVICAL BIOPSY  W/ LOOP ELECTRODE EXCISION    . CERVICAL CONE BIOPSY    . CESAREAN SECTION    . ETHMOIDECTOMY Bilateral 09/02/2015   Procedure:  TOTAL ETHMOIDECTOMY;  Surgeon: Margaretha Sheffield, MD;  Location: [facility];  Service: ENT;  Laterality: Bilateral;  . HYSTEROSCOPY W/D&C N/A 07/02/2017   Procedure: DILATATION AND CURETTAGE /HYSTEROSCOPY;  Surgeon: Defrancesco, Alanda Slim, MD;  Location: [facility];  Service: Gynecology;  Laterality: N/A;  . IMAGE GUIDED SINUS SURGERY N/A 09/02/2015   Procedure: IMAGE GUIDED SINUS SURGERY;  Surgeon: Margaretha Sheffield, MD;  Location: [facility];  Service: ENT;  Laterality: N/A;  gave disk to cece   . LAPAROSCOPIC BILATERAL SALPINGO OOPHERECTOMY Bilateral 07/02/2017   Procedure: LAPAROSCOPIC BILATERAL SALPINGO OOPHORECTOMY;  Surgeon: Brayton Mars, MD;  Location: [facility];  Service: Gynecology;  Laterality: Bilateral;  . MASTECTOMY Right 2010   total - infiltrating ductal carcinoma  . MAXILLARY ANTROSTOMY Bilateral 09/02/2015   Procedure: ENDOSCOPIC MAXILLARY ANTROSTOMY ;  Surgeon: Margaretha Sheffield, MD;  Location: [facility];  Service: ENT;  Laterality: Bilateral;  . SEPTOPLASTY N/A 09/02/2015   Procedure: SEPTOPLASTY;  Surgeon: Margaretha Sheffield, MD;  Location: [facility];  Service: ENT;  Laterality: N/A;    Prior to Admission medications   Medication Sig Start Date End Date Taking? Authorizing Provider  CALCIUM PO Take 1 tablet daily by mouth.    [provider]  cetirizine (ZYRTEC) 10 MG tablet Take 10 mg daily by mouth.  [provider]  Cholecalciferol (VITAMIN D3 PO) Take 1 capsule daily by mouth.    [provider]  docusate sodium (STOOL SOFTENER) 100 MG capsule Take 100 mg daily by mouth.     [provider]  fluticasone (FLONASE) 50 MCG/ACT nasal spray Place 2 sprays into both nostrils daily. 09/06/17   Ratcliffe, Heather R, PA-C  hydrOXYzine (ATARAX/VISTARIL) 10 MG  tablet Take 1 tablet (10 mg total) by mouth 3 (three) times daily as needed. Patient taking differently: Take 10 mg 3 (three) times daily as needed by mouth for itching.  01/15/17   Fisher, [place] Dolin, PA-C  ibuprofen (ADVIL,MOTRIN) 800 MG tablet Take 1 tablet (800 mg total) by mouth 3 (three) times daily. 07/02/17   Defrancesco, Alanda Slim, MD  levothyroxine (SYNTHROID, LEVOTHROID) 88 MCG tablet Take 1 tablet (88 mcg total) by mouth daily. 06/01/17   Fisher, [place] Dolin, PA-C  montelukast (SINGULAIR) 10 MG tablet Take 1 tablet (10 mg total) by mouth daily. 09/06/17   Ratcliffe, Heather R, PA-C  polyethylene glycol (MIRALAX / GLYCOLAX) packet Take 17 g daily as needed by mouth for moderate constipation.    [provider]  pravastatin (PRAVACHOL) 20 MG tablet TAKE 1 TABLET BY MOUTH  DAILY Patient taking differently: Take 20 mg by mouth daily 05/29/17   Caryn Section [place] Dolin, PA-C  Probiotic Product (PROBIOTIC PO) Take 1 capsule daily by mouth.    [provider]  tamoxifen (NOLVADEX) 20 MG tablet Take 20 mg daily by mouth.     [provider]    Allergies Ampicillin  Family History  Problem Relation Age of Onset  . Lung cancer Mother   . Lung cancer Father   . Ovarian cancer Neg Hx   . Breast cancer Neg Hx   . Cancer Neg Hx   . Diabetes Neg Hx   . Heart disease Neg Hx     Social History Social History   Tobacco Use  . Smoking status: Never Smoker  . Smokeless tobacco: Never Used  Substance Use Topics  . Alcohol use: Yes    Comment: occas 1 drink per month  . Drug use: No    Review of Systems  Constitutional: No fever/chills Eyes: No visual changes. ENT: No sore throat. Cardiovascular: Denies chest pain. Respiratory: Denies shortness of breath. Gastrointestinal: No abdominal pain.  No nausea, no vomiting.  No diarrhea.  No constipation. Genitourinary: Negative for dysuria. Musculoskeletal: Right wrist pain. Skin: Negative for rash. Neurological: Negative for  headaches, focal weakness or numbness. Endocrine:Hyperlipidemia and hypothyroidism.   Allergic/Immunilogical: Ampicillin  ____________________________________________   PHYSICAL EXAM:  VITAL SIGNS: ED Triage Vitals  Enc Vitals Group     BP 04/08/18 1301 (!) 120/92     Pulse Rate 04/08/18 1301 (!) 104     Resp 04/08/18 1301 18     Temp 04/08/18 1301 98 F (36.7 C)     Temp Source 04/08/18 1301 Oral     SpO2 04/08/18 1301 98 %     Weight 04/08/18 1302 145 lb (65.8 kg)     Height 04/08/18 1302 5\' 6"  (1.676 m)     Head Circumference --      Peak Flow --      Pain Score 04/08/18 1302 10     Pain Loc --      Pain Edu? --      Excl. in [place]? --    Constitutional: Alert and oriented.  Moderate distress.   Neck: No stridor.  No cervical spine tenderness to palpation. Cardiovascular: Normal rate, regular rhythm. Grossly normal heart sounds.  Good peripheral circulation. Respiratory: Normal respiratory effort.  No retractions. Lungs CTAB. Musculoskeletal: No lower extremity tenderness nor edema.  No joint effusions. Neurologic:  Normal speech and language. No gross focal neurologic deficits are appreciated. No gait instability. Skin:  Skin is warm, dry and intact. No rash noted. Psychiatric: Mood and affect are normal. Speech and behavior are normal.  ____________________________________________   LABS (all labs ordered are listed, but only abnormal results are displayed)  Labs Reviewed - No data to display ____________________________________________  EKG   ____________________________________________  RADIOLOGY  ED MD interpretation:   Official radiology report(s): Dg Wrist Complete Right  Result Date: 04/08/2018 CLINICAL DATA:  Fall. EXAM: RIGHT WRIST - COMPLETE 3+ VIEW COMPARISON:  None. FINDINGS: Comminuted, impacted, and dorsally angulated fracture of the distal radius. No dislocation. Joint spaces are preserved. Bone mineralization is normal. Diffuse soft tissue  swelling about the wrist. IMPRESSION: Acute Colles fracture of the distal radius. Electronically Signed   By: Titus Dubin M.D.   On: 04/08/2018 13:26    ____________________________________________   PROCEDURES  Procedure(s) performed: None  Procedures  Critical Care performed: No  ____________________________________________   INITIAL IMPRESSION / ASSESSMENT AND PLAN / ED COURSE  As part of my medical decision making, I reviewed the following data within the electronic MEDICAL RECORD NUMBER    Right distal wrist fracture with dorsal angulation.  Discussed with on-call orthopedics (Doctor Harlow Mares) who will come in and reduce the angulation and splint under conscious sedation.  Patient transferred to room 26.      ____________________________________________   FINAL CLINICAL IMPRESSION(S) / ED DIAGNOSES  Final diagnoses:  Closed fracture of distal end of right radius, unspecified fracture morphology, initial encounter     ED Discharge Orders    None       Note:  This document was prepared using Dragon voice recognition software and may include unintentional dictation errors.    Sable Feil, PA-C 04/08/18 1434    Earleen Newport, MD 04/08/18 [phone]

## 2018-04-08 NOTE — ED Provider Notes (Signed)
----------------------------------------- 2:39 PM on 04/08/2018 -----------------------------------------  Patient seen in conjunction with physician assistant.  Patient found to have a significant fracture of the right upper extremity/right wrist requiring conscious sedation with reduction.  Discussed the patient with Dr. Harlow Mares the orthopedic attending on call today he will be seeing the patient in the emergency department and performing a reduction.  I will be performing conscious sedation.  I discussed this with the patient she is agreeable to this plan of care of consented the patient verbally and she will sign written consent.  I have evaluated the patient, she has had anesthesia in the past without any ill effects I believe she will be a good candidate for moderate sedation in the emergency department.  I have reviewed the patient's medical history as well as medications and allergies.  .Sedation Date/Time: 04/08/2018 2:51 PM Performed by: Harvest Dark, MD Authorized by: Harvest Dark, MD   Consent:    Consent obtained:  Written and verbal (electronic informed consent)   Consent given by:  Patient and spouse   Risks discussed:  Allergic reaction, inadequate sedation, nausea, vomiting, respiratory compromise necessitating ventilatory assistance and intubation, prolonged sedation necessitating reversal and prolonged hypoxia resulting in organ damage   Alternatives discussed:  Analgesia without sedation Universal protocol:    Procedure explained and questions answered to patient or proxy's satisfaction: yes     Relevant documents present and verified: yes     Test results available and properly labeled: yes     Imaging studies available: yes     Required blood products, implants, devices, and special equipment available: yes     Immediately prior to procedure a time out was called: yes     Patient identity confirmation method:  Arm band Indications:    Procedure performed:   Fracture reduction   Procedure necessitating sedation performed by:  Different physician   Intended level of sedation:  Deep Pre-sedation assessment:    Time since last food or drink:  3 hours   ASA classification: class 1 - normal, healthy patient     Neck mobility: normal     Mouth opening:  3 or more finger widths   Mallampati score:  II - soft palate, uvula, fauces visible   Pre-sedation assessments completed and reviewed: airway patency, cardiovascular function, hydration status, mental status, nausea/vomiting, pain level, respiratory function and temperature     Pre-sedation assessment completed:  04/08/2018 2:53 PM Immediate pre-procedure details:    Reassessment: Patient reassessed immediately prior to procedure     Reviewed: vital signs, relevant labs/tests and NPO status     Verified: bag valve mask available, emergency equipment available, intubation equipment available, IV patency confirmed, oxygen available and suction available   Procedure details (see MAR for exact dosages):    Preoxygenation:  Nasal cannula   Sedation:  Etomidate   Analgesia:  Fentanyl   Intra-procedure monitoring:  Blood pressure monitoring, continuous pulse oximetry, cardiac monitor, frequent vital sign checks and frequent LOC assessments   Intra-procedure events: none     Total Provider sedation time (minutes):  25 Post-procedure details:    Post-sedation assessment completed:  04/08/2018 3:33 PM   Attendance: Constant attendance by certified staff until patient recovered     Recovery: Patient returned to pre-procedure baseline     Post-sedation assessments completed and reviewed: airway patency, cardiovascular function, hydration status, mental status and respiratory function     Patient is stable for discharge or admission: yes     Patient tolerance:  Tolerated  well, no immediate complications   ----------------------------------------- 3:54 PM on  04/08/2018 -----------------------------------------  Patient continues to appear very well.  We will discharge with Percocet and orthopedic follow-up.  Patient agreeable to plan of care.      Harvest Dark, MD 04/08/18 [phone]

## 2018-04-08 NOTE — Op Note (Addendum)
   7:04 PM  PATIENT:  Ashley Hampton    PRE-OPERATIVE DIAGNOSIS:  Right distal radius fracture, closed, displaced  POST-OPERATIVE DIAGNOSIS:  Same  PROCEDURE:  Closed reduction of right distal radius  SURGEON:  Lovell Sheehan, MD  ANESTHESIA:   IV sedation  The risks benefits and alternatives were discussed with the patient preoperatively including but not limited to the risks of non-union, nerve injury, cardiopulmonary complications, the need for revision surgery, among others, and the patient was willing to proceed.  EBL: none  TOURNIQUET TIME: none  OPERATIVE PROCEDURE: After adequate sedation was achieved the right wrist was reduced with traction and volar positioning of the wrist. Finger traps were used to maintain the reduction and a sugar tong splint was applied. She tolerated the procedure well and will see me in the office in 3 to 5 days.  Elyn Aquas. Harlow Mares, MD

## 2018-04-08 NOTE — ED Notes (Signed)
Pt moved to room 25   Report called to [employer]

## 2018-05-02 ENCOUNTER — Other Ambulatory Visit: Payer: Self-pay

## 2018-05-02 DIAGNOSIS — E538 Deficiency of other specified B group vitamins: Secondary | ICD-10-CM

## 2018-05-02 MED ORDER — CYANOCOBALAMIN 1000 MCG/ML IJ SOLN
1000.0000 ug | Freq: Once | INTRAMUSCULAR | Status: AC
Start: 2018-05-02 — End: 2018-05-02
  Administered 2018-05-02: 1000 ug via INTRAMUSCULAR

## 2018-06-03 ENCOUNTER — Other Ambulatory Visit: Payer: Self-pay

## 2018-06-03 DIAGNOSIS — E538 Deficiency of other specified B group vitamins: Secondary | ICD-10-CM

## 2018-06-03 MED ORDER — CYANOCOBALAMIN 1000 MCG/ML IJ SOLN
1000.0000 ug | Freq: Once | INTRAMUSCULAR | Status: AC
  Administered 2018-06-03: 1000 ug via INTRAMUSCULAR

## 2018-07-08 ENCOUNTER — Other Ambulatory Visit: Payer: Self-pay

## 2018-07-08 DIAGNOSIS — E538 Deficiency of other specified B group vitamins: Secondary | ICD-10-CM

## 2018-07-08 MED ORDER — CYANOCOBALAMIN 1000 MCG/ML IJ SOLN
1000.0000 ug | Freq: Once | INTRAMUSCULAR | Status: AC
  Administered 2018-07-08: 1000 ug via INTRAMUSCULAR

## 2018-08-05 ENCOUNTER — Ambulatory Visit: Payer: Self-pay | Admitting: Emergency Medicine

## 2018-08-05 ENCOUNTER — Ambulatory Visit: Payer: Self-pay

## 2018-08-05 VITALS — BP 108/68 | HR 83 | Temp 97.9°F | Resp 14

## 2018-08-05 VITALS — BP 108/60 | HR 83 | Temp 97.8°F | Resp 14

## 2018-08-05 DIAGNOSIS — J0101 Acute recurrent maxillary sinusitis: Secondary | ICD-10-CM

## 2018-08-05 DIAGNOSIS — E538 Deficiency of other specified B group vitamins: Secondary | ICD-10-CM

## 2018-08-05 MED ORDER — CYANOCOBALAMIN 1000 MCG/ML IJ SOLN
1000.0000 ug | Freq: Once | INTRAMUSCULAR | Status: AC
  Administered 2018-08-05: 1000 ug via INTRAMUSCULAR

## 2018-08-05 MED ORDER — AZELASTINE HCL 0.1 % NA SOLN: 2.0000 | Freq: Two times a day (BID) | NASAL | 12 refills | Status: DC

## 2018-08-05 MED ORDER — DOXYCYCLINE HYCLATE 100 MG PO CAPS: 100.0000 mg | ORAL_CAPSULE | Freq: Two times a day (BID) | ORAL | 0 refills | Status: DC

## 2018-08-05 MED ORDER — FLUCONAZOLE 150 MG PO TABS: 150.0000 mg | ORAL_TABLET | Freq: Once | ORAL | 0 refills | Status: AC

## 2018-08-05 MED ORDER — AZITHROMYCIN 250 MG PO TABS: ORAL_TABLET | ORAL | 0 refills | Status: DC

## 2018-08-05 NOTE — Progress Notes (Addendum)
Subjective: Patient enters with sinus symptoms for 3 weeks.  She has significant nasal congestion along with purulent nasal discharge.  She feels full in both of her ears.  She does have a history of recurrent sinus infections.  She has a history of ampicillin allergy specifics unknown but definitely did have a rash.  She has taken a Z-Pak in the past without difficulty.  She has been using Sudafed but limited because of her history of palpitations when she takes it. Review of systems: Minimal cough. No fever. No chest or abdominal discomfort. Objective: Alert and cooperative in no distress. Neck supple without adenopathy. TMs are clear. Chest clear to auscultation. Heart regular rate and rhythm. Tenderness over both maxillary sinuses. Assessment: Patient presents with recurrent bilateral maxillary sinusitis. Plan: Saline flush Add azelastine nasal spray. Continue Flonase. Doxycycline Diflucan prescription given for history of yeast with antibiotics

## 2018-08-05 NOTE — Patient Instructions (Signed)
Take antibiotics as instructed. Continue doing nasal flushes. Avoid Sudafed due to tachycardia. Continue Flonase and try the azelastine nasal spray Follow-up with your primary care physician if persistent symptoms.   Sinusitis, Adult Sinusitis is soreness and swelling (inflammation) of your sinuses. Sinuses are hollow spaces in the bones around your face. They are located:  Around your eyes.  In the middle of your forehead.  Behind your nose.  In your cheekbones. Your sinuses and nasal passages are lined with a fluid called mucus. Mucus drains out of your sinuses. Swelling can trap mucus in your sinuses. This lets germs (bacteria, virus, or fungus) grow, which leads to infection. Most of the time, this condition is caused by a virus. What are the causes? This condition is caused by:  Allergies.  Asthma.  Germs.  Things that block your nose or sinuses.  Growths in the nose (nasal polyps).  Chemicals or irritants in the air.  Fungus (rare). What increases the risk? You are more likely to develop this condition if:  You have a weak body defense system (immune system).  You do a lot of swimming or diving.  You use nasal sprays too much.  You smoke. What are the signs or symptoms? The main symptoms of this condition are pain and a feeling of pressure around the sinuses. Other symptoms include:  Stuffy nose (congestion).  Runny nose (drainage).  Swelling and warmth in the sinuses.  Headache.  Toothache.  A cough that may get worse at night.  Mucus that collects in the throat or the back of the nose (postnasal drip).  Being unable to smell and taste.  Being very tired (fatigue).  A fever.  Sore throat.  Bad breath. How is this diagnosed? This condition is diagnosed based on:  Your symptoms.  Your medical history.  A physical exam.  Tests to find out if your condition is short-term (acute) or long-term (chronic). Your doctor may: ? Check your  nose for growths (polyps). ? Check your sinuses using a tool that has a light (endoscope). ? Check for allergies or germs. ? Do imaging tests, such as an MRI or CT scan. How is this treated? Treatment for this condition depends on the cause and whether it is short-term or long-term.  If caused by a virus, your symptoms should go away on their own within 10 days. You may be given medicines to relieve symptoms. They include: ? Medicines that shrink swollen tissue in the nose. ? Medicines that treat allergies (antihistamines). ? A spray that treats swelling of the nostrils. ? Rinses that help get rid of thick mucus in your nose (nasal saline washes).  If caused by bacteria, your doctor may wait to see if you will get better without treatment. You may be given antibiotic medicine if you have: ? A very bad infection. ? A weak body defense system.  If caused by growths in the nose, you may need to have surgery. Follow these instructions at home: Medicines  Take, use, or apply over-the-counter and prescription medicines only as told by your doctor. These may include nasal sprays.  If you were prescribed an antibiotic medicine, take it as told by your doctor. Do not stop taking the antibiotic even if you start to feel better. Hydrate and humidify   Drink enough water to keep your pee (urine) pale yellow.  Use a cool mist humidifier to keep the humidity level in your home above 50%.  Breathe in steam for 10-15 minutes, 3-4 times  a day, or as told by your doctor. You can do this in the bathroom while a hot shower is running.  Try not to spend time in cool or dry air. Rest  Rest as much as you can.  Sleep with your head raised (elevated).  Make sure you get enough sleep each night. General instructions   Put a warm, moist washcloth on your face 3-4 times a day, or as often as told by your doctor. This will help with discomfort.  Wash your hands often with soap and water. If there  is no soap and water, use hand sanitizer.  Do not smoke. Avoid being around people who are smoking (secondhand smoke).  Keep all follow-up visits as told by your doctor. This is important. Contact a doctor if:  You have a fever.  Your symptoms get worse.  Your symptoms do not get better within 10 days. Get help right away if:  You have a very bad headache.  You cannot stop throwing up (vomiting).  You have very bad pain or swelling around your face or eyes.  You have trouble seeing.  You feel confused.  Your neck is stiff.  You have trouble breathing. Summary  Sinusitis is swelling of your sinuses. Sinuses are hollow spaces in the bones around your face.  This condition is caused by tissues in your nose that become inflamed or swollen. This traps germs. These can lead to infection.  If you were prescribed an antibiotic medicine, take it as told by your doctor. Do not stop taking it even if you start to feel better.  Keep all follow-up visits as told by your doctor. This is important. This information is not intended to replace advice given to you by your health care provider. Make sure you discuss any questions you have with your health care provider. Document Released: 01/10/2008 Document Revised: 12/24/2017 Document Reviewed: 12/24/2017 Elsevier Interactive Patient Education  2019 [employer].

## 2018-09-05 ENCOUNTER — Ambulatory Visit: Payer: Self-pay

## 2018-09-05 DIAGNOSIS — E538 Deficiency of other specified B group vitamins: Secondary | ICD-10-CM

## 2018-09-05 MED ORDER — CYANOCOBALAMIN 1000 MCG/ML IJ SOLN
1000.0000 ug | Freq: Once | INTRAMUSCULAR | Status: AC
  Administered 2018-09-05: 1000 ug via INTRAMUSCULAR

## 2018-10-07 ENCOUNTER — Other Ambulatory Visit: Payer: Self-pay | Admitting: Adult Health

## 2018-10-07 ENCOUNTER — Ambulatory Visit: Payer: Managed Care, Other (non HMO) | Admitting: Adult Health

## 2018-10-07 ENCOUNTER — Encounter: Payer: Self-pay | Admitting: Adult Health

## 2018-10-07 VITALS — BP 90/68 | HR 107 | Temp 98.0°F | Resp 12

## 2018-10-07 DIAGNOSIS — R0982 Postnasal drip: Secondary | ICD-10-CM

## 2018-10-07 DIAGNOSIS — H6983 Other specified disorders of Eustachian tube, bilateral: Secondary | ICD-10-CM

## 2018-10-07 DIAGNOSIS — J0100 Acute maxillary sinusitis, unspecified: Secondary | ICD-10-CM

## 2018-10-07 MED ORDER — PREDNISONE 10 MG (21) PO TBPK: ORAL_TABLET | ORAL | 0 refills | Status: DC

## 2018-10-07 MED ORDER — DOXYCYCLINE HYCLATE 100 MG PO TABS: 100.0000 mg | ORAL_TABLET | Freq: Two times a day (BID) | ORAL | 0 refills | Status: DC

## 2018-10-07 MED ORDER — FLUCONAZOLE 150 MG PO TABS: 150.0000 mg | ORAL_TABLET | Freq: Once | ORAL | 0 refills | Status: AC

## 2018-10-07 MED ORDER — CYANOCOBALAMIN 1000 MCG/ML IJ SOLN
1000.0000 ug | Freq: Once | INTRAMUSCULAR | Status: AC
  Administered 2018-10-07: 1000 ug via INTRAMUSCULAR

## 2018-10-07 NOTE — Patient Instructions (Signed)

## 2018-10-07 NOTE — Progress Notes (Signed)
[facility]  Subjective:     Patient ID: NATALE BARBA, female   DOB: 30-Aug-1955, 63 y.o.   MRN: [id number]  HPI  Blood pressure 90/68, pulse (!) 107, temperature 98 F (36.7 C), temperature source Oral, resp. rate 12, SpO2 100 %. Patient reports this has always been her blood pressure.  She has taken sudafed today.   Patient is a 63 year old female in no acute distress who comes to the clinic for complaints of cold symptoms around 1.5 weeks ago, ear pain, facial pressure, sore throat. Fatigue.  She has Ampicillin allergy she reports she has taken Amoxicillin, Augmentin. ( provider will avoid given allergy note)   She has had a history of sinus surgery. She has an ENT. No surgery recently.  Son in law has had flu 3 weeks ago.  Many exposures to illness she reports,.  No LMP recorded. Patient is postmenopausal.  Patient  denies any fever, body aches,chills, rash, chest pain, shortness of breath, nausea, vomiting, or diarrhea.   Allergies  Allergen Reactions  . Ampicillin Rash, Other (See Comments) and Hives    Has patient had a PCN reaction causing immediate rash, facial/tongue/throat swelling, SOB or lightheadedness with hypotension: Yes Has patient had a PCN reaction causing severe rash involving mucus membranes or skin necrosis: No Has patient had a PCN reaction that required hospitalization: No Has patient had a PCN reaction occurring within the last 10 years: No If all of the above answers are "NO", then may proceed with Cephalosporin use.      Review of Systems  Constitutional: Positive for fatigue. Negative for activity change, appetite change, chills, diaphoresis, fever and unexpected weight change.  HENT: Positive for congestion, ear pain, postnasal drip, rhinorrhea, sinus pressure and sinus pain. Negative for dental problem, drooling, ear discharge, facial swelling, hearing loss, mouth sores, nosebleeds, sneezing, sore throat,  tinnitus, trouble swallowing and voice change.   Eyes: Negative.   Respiratory: Negative.   Cardiovascular: Negative.   Gastrointestinal: Negative.   Endocrine: Negative.   Genitourinary: Negative.   Musculoskeletal: Negative.   Skin: Negative.   Neurological: Negative.   Hematological: Negative.   Psychiatric/Behavioral: Negative.        Objective:   Physical Exam Vitals signs reviewed.  Constitutional:      General: She is not in acute distress.    Appearance: Normal appearance. She is not ill-appearing, toxic-appearing or diaphoretic.     Comments: Patient is alert and oriented and responsive to questions Engages in eye contact with provider. Speaks in full sentences without any pauses without any shortness of breath or distress.    HENT:     Head: Normocephalic and atraumatic.     Jaw: There is normal jaw occlusion.     Salivary Glands: Right salivary gland is not diffusely enlarged or tender. Left salivary gland is not diffusely enlarged or tender.     Right Ear: Hearing, ear canal and external ear normal. A middle ear effusion is present. Tympanic membrane is bulging. Tympanic membrane is not erythematous.     Left Ear: Hearing, ear canal and external ear normal. A middle ear effusion is present. Tympanic membrane is bulging. Tympanic membrane is not erythematous.     Nose: Congestion and rhinorrhea present.     Right Sinus: Maxillary sinus tenderness present. No frontal sinus tenderness.     Left Sinus: Maxillary sinus tenderness present. No frontal sinus tenderness.     Mouth/Throat:  Lips: Pink.     Mouth: Mucous membranes are moist.     Pharynx: Oropharynx is clear. Uvula midline. No pharyngeal swelling, oropharyngeal exudate, posterior oropharyngeal erythema or uvula swelling.     Tonsils: Swelling: 0 on the right. 0 on the left.  Eyes:     General: No scleral icterus.       Right eye: No discharge.        Left eye: No discharge.     Conjunctiva/sclera:  Conjunctivae normal.     Pupils: Pupils are equal, round, and reactive to light.  Neck:     Musculoskeletal: Normal range of motion and neck supple. No neck rigidity or muscular tenderness.  Cardiovascular:     Rate and Rhythm: Normal rate and regular rhythm.     Pulses: Normal pulses.     Heart sounds: Normal heart sounds. No murmur. No friction rub. No gallop.   Pulmonary:     Effort: Pulmonary effort is normal. No respiratory distress.     Breath sounds: Normal breath sounds. No stridor. No wheezing, rhonchi or rales.  Chest:     Chest wall: No tenderness.  Musculoskeletal: Normal range of motion.     Comments: Patient moves on and off of exam table and in room without difficulty. Gait is normal in hall and in room. Patient is oriented to person place time and situation. Patient answers questions appropriately and engages in conversation.   Lymphadenopathy:     Cervical: No cervical adenopathy.     Right cervical: No superficial, deep or posterior cervical adenopathy.    Left cervical: No superficial, deep or posterior cervical adenopathy.  Skin:    General: Skin is warm and dry.     Capillary Refill: Capillary refill takes less than 2 seconds.  Neurological:     Mental Status: She is alert and oriented to person, place, and time.     Sensory: Sensation is intact.     Motor: Motor function is intact.     Coordination: Coordination is intact.     Gait: Gait is intact. Gait normal.     Comments:    Psychiatric:        Mood and Affect: Mood normal.        Behavior: Behavior normal.        Thought Content: Thought content normal.        Judgment: Judgment normal.        Assessment:     Acute non-recurrent maxillary sinusitis  Eustachian tube dysfunction, bilateral  Vitamin B12 deficiency - Plan: cyanocobalamin ((VITAMIN B-12)) injection 1,000 mcg      Plan:     Hasini was seen today for nasal congestion, cough and ear pain.  Diagnoses and all orders for this  visit:  Acute non-recurrent maxillary sinusitis  Eustachian tube dysfunction, bilateral  Vitamin B12 deficiency -     cyanocobalamin ((VITAMIN B-12)) injection 1,000 mcg  Other orders -     doxycycline (VIBRA-TABS) 100 MG tablet; Take 1 tablet (100 mg total) by mouth 2 (two) times daily. -     fluconazole (DIFLUCAN) 150 MG tablet; Take 1 tablet (150 mg total) by mouth once for 1 dose. -     predniSONE (STERAPRED UNI-PAK 21 TAB) 10 MG (21) TBPK tablet; PO: Take 6 tablets on day 1:Take 5 tablets day 2:Take 4 tablets day 3: Take 3 tablets day 4:Take 2 tablets day five: 5 Take 1 tablet day 6  Ampicillin allergy listed.  Patient requests Diflucan if  antibiotic yeast infection develops. If symptoms persist longer than 3 days she will seek gynecology care. She denies any liver dysfunction.  She will start antibiotic above- use her Zyrtec daily and Nasal spray daily per instructions on package.   If ear fullness persists past day 3 she will start Prednisone dose pack.  Avoid all NSAID'S while on Prednisone.  Follow up with your Ear nose and throat doctor if no improvement in 1 week or if any symptoms worsen at anytime.   Advised patient call the office or your primary care doctor for an appointment if no improvement within 72 hours or if any symptoms change or worsen at any time  Advised ER or urgent Care if after hours or on weekend. Call 911 for emergency symptoms at any time.Patinet verbalized understanding of all instructions given/reviewed and treatment plan and has no further questions or concerns at this time.    Patient verbalized understanding of all instructions given and denies any further questions at this time.

## 2018-10-08 NOTE — Progress Notes (Signed)
Opened in error - no addendum

## 2018-11-07 ENCOUNTER — Ambulatory Visit: Payer: Managed Care, Other (non HMO)

## 2018-11-07 ENCOUNTER — Other Ambulatory Visit: Payer: Self-pay

## 2018-11-07 DIAGNOSIS — E538 Deficiency of other specified B group vitamins: Secondary | ICD-10-CM

## 2018-11-07 MED ORDER — CYANOCOBALAMIN 1000 MCG/ML IJ SOLN
1000.0000 ug | Freq: Once | INTRAMUSCULAR | Status: AC
  Administered 2018-11-07: 10:00:00 1000 ug via INTRAMUSCULAR

## 2018-11-07 NOTE — Progress Notes (Signed)
Ordered Prescriptions - documented in this encounter Prescription Sig Dispensed Refills Start Date End Date  cyanocobalamin (VITAMIN B12) 1,000 mcg/mL injection  Inject 1 mL (1,000 mcg total) subcutaneously monthly 1 mL  0 10/04/2018      [name], [place]  [employer]

## 2018-11-07 NOTE — Addendum Note (Signed)
Addended by: Judie Petit on: 11/07/2018 10:14 AM   Modules accepted: Level of Service

## 2018-11-27 NOTE — Addendum Note (Signed)
Addended by: Doreen Beam on: 11/27/2018 11:36 AM   Modules accepted: Level of Service

## 2018-11-27 NOTE — Addendum Note (Signed)
Addended by: Doreen Beam on: 11/27/2018 10:10 AM   Modules accepted: Level of Service

## 2018-12-10 ENCOUNTER — Ambulatory Visit: Payer: Managed Care, Other (non HMO)

## 2018-12-10 ENCOUNTER — Other Ambulatory Visit: Payer: Self-pay

## 2018-12-10 DIAGNOSIS — E538 Deficiency of other specified B group vitamins: Secondary | ICD-10-CM

## 2018-12-10 MED ORDER — CYANOCOBALAMIN 1000 MCG/ML IJ SOLN
1000.0000 ug | Freq: Once | INTRAMUSCULAR | Status: AC
  Administered 2018-12-10: 10:00:00 1000 ug via INTRAMUSCULAR

## 2018-12-18 ENCOUNTER — Telehealth: Payer: Self-pay

## 2018-12-18 NOTE — Telephone Encounter (Signed)
Pt prescreened no symptoms. Has face mask.    Coronavirus (COVID-19) Are you at risk?  Are you at risk for the Coronavirus (COVID-19)?  To be considered HIGH RISK for Coronavirus (COVID-19), you have to meet the following criteria:  . Traveled to [place], [place], [place], [place] or [place]; or in the [place] to [place], [place], [place], or [place]; and have fever, cough, and shortness of breath within the last 2 weeks of travel OR . Been in close contact with a person diagnosed with COVID-19 within the last 2 weeks and have fever, cough, and shortness of breath . IF YOU DO NOT MEET THESE CRITERIA, YOU ARE CONSIDERED LOW RISK FOR COVID-19.  What to do if you are HIGH RISK for COVID-19?  Marland Kitchen If you are having a medical emergency, call 911. . Seek medical care right away. Before you go to a doctor's office, urgent care or emergency department, call ahead and tell them about your recent travel, contact with someone diagnosed with COVID-19, and your symptoms. You should receive instructions from your physician's office regarding next steps of care.  . When you arrive at healthcare provider, tell the healthcare staff immediately you have returned from visiting [place], [place], [place], [place] or [place]; or traveled in the [place] to [place], [place], [place], or [place]; in the last two weeks or you have been in close contact with a person diagnosed with COVID-19 in the last 2 weeks.   . Tell the health care staff about your symptoms: fever, cough and shortness of breath. . After you have been seen by a medical provider, you will be either: o Tested for (COVID-19) and discharged home on quarantine except to seek medical care if symptoms worsen, and asked to  - Stay home and avoid contact with others until you get your results (4-5 days)  - Avoid travel on public transportation if possible (such as bus, train, or airplane) or o Sent to the Emergency Department by EMS for  evaluation, COVID-19 testing, and possible admission depending on your condition and test results.  What to do if you are LOW RISK for COVID-19?  Reduce your risk of any infection by using the same precautions used for avoiding the common cold or flu:  Marland Kitchen Wash your hands often with soap and warm water for at least 20 seconds.  If soap and water are not readily available, use an alcohol-based hand sanitizer with at least 60% alcohol.  . If coughing or sneezing, cover your mouth and nose by coughing or sneezing into the elbow areas of your shirt or coat, into a tissue or into your sleeve (not your hands). . Avoid shaking hands with others and consider head nods or verbal greetings only. . Avoid touching your eyes, nose, or mouth with unwashed hands.  . Avoid close contact with people who are sick. . Avoid places or events with large numbers of people in one location, like concerts or sporting events. . Carefully consider travel plans you have or are making. . If you are planning any travel outside or inside the [place], visit the CDC's Travelers' Health webpage for the latest health notices. . If you have some symptoms but not all symptoms, continue to monitor at home and seek medical attention if your symptoms worsen. . If you are having a medical emergency, call 911.   ADDITIONAL HEALTHCARE OPTIONS FOR PATIENTS  [facility] / e-Visit: [email or web address]  [facility]: [facility]: [phone]                   [facility]: [phone]

## 2018-12-19 ENCOUNTER — Ambulatory Visit: Payer: Managed Care, Other (non HMO) | Admitting: Obstetrics and Gynecology

## 2018-12-19 ENCOUNTER — Encounter: Payer: Self-pay | Admitting: Obstetrics and Gynecology

## 2018-12-19 ENCOUNTER — Other Ambulatory Visit: Payer: Self-pay

## 2018-12-19 VITALS — BP 93/60 | HR 77 | Ht 66.0 in | Wt 144.2 lb

## 2018-12-19 DIAGNOSIS — N952 Postmenopausal atrophic vaginitis: Secondary | ICD-10-CM

## 2018-12-19 MED ORDER — ESTROGENS, CONJUGATED 0.625 MG/GM VA CREA: TOPICAL_CREAM | VAGINAL | 1 refills | Status: DC

## 2018-12-19 NOTE — Progress Notes (Signed)
HPI:      [name] is a 63 y.o. G1P1001 who LMP was No LMP recorded. Patient is postmenopausal.  Subjective:   She presents today with complaint of her bladder falling out.  She states that it causes pelvic pressure and is annoying to her.  She reports no urine loss.  She has occasionally been using tampons and that seems to hold everything inside and she is happy with this method.  She has occasionally noted a small amount of spotting on the tampon and this has concerned her. Of significant note patient had a history of breast cancer 10 years ago. She had a D&C with oophorectomy 1 year ago.  D&C showed benign endometrium.    Hx: The following portions of the patient's history were reviewed and updated as appropriate:             She  has a past medical history of AR (allergic rhinitis), Breast cancer ([place]), DJD (degenerative joint disease), DJD (degenerative joint disease), Hypercholesteremia, Hypoglycemia, Hypothyroid, Menopausal state, Multinodular goiter, Osteopenia, PONV (postoperative nausea and vomiting), Seasonal allergies, Severe dysplasia of cervix (CIN III), SUI (stress urinary incontinence, female) (05/20/2015), Vaginal atrophy, and Vitamin D deficiency. She does not have any pertinent problems on file. She  has a past surgical history that includes Cervical biopsy w/ loop electrode excision; Cesarean section; Cervical cone biopsy; Image guided sinus surgery (N/A, 09/02/2015); Septoplasty (N/A, 09/02/2015); Maxillary antrostomy (Bilateral, 09/02/2015); Ethmoidectomy (Bilateral, 09/02/2015); breast reconstruction (Right, 2010); Breast lumpectomy; Mastectomy (Right, 2010); Hysteroscopy w/D&C (N/A, 07/02/2017); and Laparoscopic bilateral salpingo oophorectomy (Bilateral, 07/02/2017). Her family history includes Lung cancer in her father and mother. She  reports that she has never smoked. She has never used smokeless tobacco. She reports current alcohol use. She reports that she does not  use drugs. She has a current medication list which includes the following prescription(s): calcium, cetirizine, cholecalciferol, cholecalciferol, cyanocobalamin, fluticasone, hydroxyzine, ibuprofen, levothyroxine, magnesium oxide, montelukast, polyethylene glycol, pravastatin, probiotic product, aspirin ec, and conjugated estrogens. She is allergic to ampicillin.       Review of Systems:  Review of Systems  Constitutional: Denied constitutional symptoms, night sweats, recent illness, fatigue, fever, insomnia and weight loss.  Eyes: Denied eye symptoms, eye pain, photophobia, vision change and visual disturbance.  Ears/Nose/Throat/Neck: Denied ear, nose, throat or neck symptoms, hearing loss, nasal discharge, sinus congestion and sore throat.  Cardiovascular: Denied cardiovascular symptoms, arrhythmia, chest pain/pressure, edema, exercise intolerance, orthopnea and palpitations.  Respiratory: Denied pulmonary symptoms, asthma, pleuritic pain, productive sputum, cough, dyspnea and wheezing.  Gastrointestinal: Denied, gastro-esophageal reflux, melena, nausea and vomiting.  Genitourinary: See HPI for additional information.  Musculoskeletal: Denied musculoskeletal symptoms, stiffness, swelling, muscle weakness and myalgia.  Dermatologic: Denied dermatology symptoms, rash and scar.  Neurologic: Denied neurology symptoms, dizziness, headache, neck pain and syncope.  Psychiatric: Denied psychiatric symptoms, anxiety and depression.  Endocrine: Denied endocrine symptoms including hot flashes and night sweats.   Meds:   Current Outpatient Medications on File Prior to Visit  Medication Sig Dispense Refill  . CALCIUM PO Take 1 tablet daily by mouth.    . cetirizine (ZYRTEC) 10 MG tablet Take 10 mg daily by mouth.    . Cholecalciferol (VITAMIN D3 PO) Take 1 capsule daily by mouth.    . Cholecalciferol 10 MCG (400 UNIT) CAPS Take by mouth.    . cyanocobalamin (,VITAMIN B-12,) 1000 MCG/ML injection  Inject into the skin.    . fluticasone (FLONASE) 50 MCG/ACT nasal spray Place 2 sprays into  both nostrils daily. 16 g 12  . hydrOXYzine (ATARAX/VISTARIL) 10 MG tablet Take 1 tablet (10 mg total) by mouth 3 (three) times daily as needed. (Patient taking differently: Take 10 mg 3 (three) times daily as needed by mouth for itching. ) 30 tablet 6  . ibuprofen (ADVIL,MOTRIN) 800 MG tablet Take 1 tablet (800 mg total) by mouth 3 (three) times daily. 30 tablet 1  . levothyroxine (SYNTHROID, LEVOTHROID) 75 MCG tablet Take by mouth.    . magnesium oxide (MAG-OX) 400 MG tablet Take by mouth.    . montelukast (SINGULAIR) 10 MG tablet Take 1 tablet (10 mg total) by mouth daily. 90 tablet 3  . polyethylene glycol (MIRALAX / GLYCOLAX) packet Take 17 g daily as needed by mouth for moderate constipation.    . pravastatin (PRAVACHOL) 20 MG tablet TAKE 1 TABLET BY MOUTH  DAILY (Patient taking differently: Take 20 mg by mouth daily) 90 tablet 3  . Probiotic Product (PROBIOTIC PO) Take 1 capsule daily by mouth.    Marland Kitchen aspirin EC 81 MG tablet Take by mouth.     No current facility-administered medications on file prior to visit.     Objective:     Vitals:   12/19/18 0821  BP: 93/60  Pulse: 77              Physical examination   Pelvic:   Vulva: Normal appearance.  No lesions.  Vagina: No lesions or abnormalities noted.  Significant atrophic vaginitis  Support: Normal pelvic support.  Small cystocele and small rectocele  Urethra No masses tenderness or scarring.  Meatus Normal size without lesions or prolapse.  Cervix: Normal appearance.  No lesions.  Anus: Normal exam.  No lesions.  Perineum: Normal exam.  No lesions.        Bimanual   Uterus:  Small.  Non-tender.  Mobile.  AV.  Adnexae: No masses.  Non-tender to palpation.  Cul-de-sac: Negative for abnormality.     Assessment:    G1P1001 Patient Active Problem List   Diagnosis Date Noted  . Status post laparoscopic bilateral  salpingo-oophorectomy (BSO) 07/02/2017  . Mitral valve prolapse 07/01/2017  . Hyperlipidemia, mixed 06/15/2017  . Hypothyroid 06/15/2017  . Palpitation 06/15/2017  . Vitamin B12 deficiency 06/15/2017  . Rectocele 05/16/2017  . Cystocele, midline 05/16/2017  . Vaginal odor 05/16/2017  . Pelvic pain in female 05/16/2017  . H/O osteopenia 11/23/2016  . Encounter for monitoring tamoxifen therapy 11/21/2016  . S/P D&C (status post dilation and curettage)/hysteroscopy 07/25/2016  . History of breast cancer in female 07/25/2016  . Breast cancer ([place]) 05/20/2015  . Menopause 05/20/2015  . Vaginal atrophy 05/20/2015  . SUI (stress urinary incontinence, female) 05/20/2015  . Osteopenia 05/20/2015  . History of cervical dysplasia 05/20/2015  . Malignant neoplasm of breast ([place]) 04/30/2012     1. Atrophic vaginitis     Good pelvic support on examination.  Spotting with tampon use likely secondary to atrophic vaginitis.  No obvious bladder prolapse.   Plan:            1.  Premarin use vaginally for atrophic vaginitis.  Patient would like to continue to occasionally use tampons to support her pelvic structures.  Declined pessary suggestion.  We have discussed surgery in great detail including the risks and benefits and have walked through each procedure including cystocele repair rectocele repair mesh urethral sling and hysterectomy.  All of her questions were answered. Orders No orders of the defined types were placed in  this encounter.    Meds ordered this encounter  Medications  . conjugated estrogens (PREMARIN) vaginal cream    Sig: 1/3 applicator full intravaginal QOD    Dispense:  60 g    Refill:  1      F/U  Return in about 2 months (around 02/18/2019). I spent 27 minutes involved in the care of this patient of which greater than 50% was spent discussing pelvic prolapse, history of breast cancer, review of surgeries, future surgery for pelvic prolapse if needed, pessary use,  atrophic vaginitis, treatment of atrophic vaginitis.  All questions answered  Finis Bud, M.D. 12/19/2018 9:06 AM

## 2018-12-19 NOTE — Progress Notes (Signed)
Patient comes in today for protruding bladder. She is having some discomfort.

## 2019-01-10 ENCOUNTER — Ambulatory Visit: Payer: Managed Care, Other (non HMO)

## 2019-01-10 ENCOUNTER — Other Ambulatory Visit: Payer: Self-pay

## 2019-01-10 DIAGNOSIS — E538 Deficiency of other specified B group vitamins: Secondary | ICD-10-CM

## 2019-01-10 MED ORDER — CYANOCOBALAMIN 1000 MCG/ML IJ SOLN
1000.0000 ug | Freq: Once | INTRAMUSCULAR | Status: AC
  Administered 2019-01-10: 1000 ug via INTRAMUSCULAR

## 2019-02-04 ENCOUNTER — Encounter (INDEPENDENT_AMBULATORY_CARE_PROVIDER_SITE_OTHER): Payer: Managed Care, Other (non HMO) | Admitting: Ophthalmology

## 2019-02-04 ENCOUNTER — Other Ambulatory Visit: Payer: Self-pay

## 2019-02-04 DIAGNOSIS — H43813 Vitreous degeneration, bilateral: Secondary | ICD-10-CM | POA: Diagnosis not present

## 2019-02-04 DIAGNOSIS — H2513 Age-related nuclear cataract, bilateral: Secondary | ICD-10-CM

## 2019-02-10 ENCOUNTER — Ambulatory Visit: Payer: Managed Care, Other (non HMO)

## 2019-02-10 ENCOUNTER — Other Ambulatory Visit: Payer: Self-pay

## 2019-02-10 DIAGNOSIS — E538 Deficiency of other specified B group vitamins: Secondary | ICD-10-CM | POA: Diagnosis not present

## 2019-02-10 MED ORDER — CYANOCOBALAMIN 1000 MCG/ML IJ SOLN
1000.0000 ug | Freq: Once | INTRAMUSCULAR | Status: AC
  Administered 2019-02-10: 13:00:00 1000 ug via INTRAMUSCULAR

## 2019-03-13 ENCOUNTER — Other Ambulatory Visit: Payer: Managed Care, Other (non HMO)

## 2019-03-13 ENCOUNTER — Other Ambulatory Visit: Payer: Self-pay

## 2019-03-13 DIAGNOSIS — E538 Deficiency of other specified B group vitamins: Secondary | ICD-10-CM

## 2019-03-13 MED ORDER — CYANOCOBALAMIN 1000 MCG/ML IJ SOLN
1000.0000 ug | Freq: Once | INTRAMUSCULAR | Status: AC
  Administered 2019-03-13: 1000 ug via INTRAMUSCULAR

## 2019-03-13 NOTE — Progress Notes (Signed)
Patient ID: Ashley Hampton, female   DOB: [birth year], 63 y.o.   MRN: [id number] Ordered Prescriptions - documented in this encounter Prescription Sig Dispensed Refills Start Date End Date  cyanocobalamin (VITAMIN B12) 1,000 mcg/mL injection  Inject 1 mL (1,000 mcg total) subcutaneously monthly 1 mL  0 10/04/2018      [name], [place]  [facility]

## 2019-06-19 ENCOUNTER — Encounter (INDEPENDENT_AMBULATORY_CARE_PROVIDER_SITE_OTHER): Payer: Medicare Other | Admitting: Ophthalmology

## 2019-06-19 DIAGNOSIS — H2513 Age-related nuclear cataract, bilateral: Secondary | ICD-10-CM

## 2019-06-19 DIAGNOSIS — H33021 Retinal detachment with multiple breaks, right eye: Secondary | ICD-10-CM

## 2019-06-19 DIAGNOSIS — H43813 Vitreous degeneration, bilateral: Secondary | ICD-10-CM | POA: Diagnosis not present

## 2019-06-23 ENCOUNTER — Other Ambulatory Visit: Payer: Self-pay | Admitting: Family Medicine

## 2019-06-23 DIAGNOSIS — Z780 Asymptomatic menopausal state: Secondary | ICD-10-CM

## 2019-07-02 ENCOUNTER — Encounter (INDEPENDENT_AMBULATORY_CARE_PROVIDER_SITE_OTHER): Payer: Medicare Other | Admitting: Ophthalmology

## 2019-07-02 DIAGNOSIS — H33301 Unspecified retinal break, right eye: Secondary | ICD-10-CM

## 2019-09-03 ENCOUNTER — Ambulatory Visit
Admission: RE | Admit: 2019-09-03 | Discharge: 2019-09-03 | Disposition: A | Discharge status: Home / Self Care | Payer: Medicare Other | Source: Ambulatory Visit | Attending: Family Medicine | Admitting: Family Medicine

## 2019-09-03 ENCOUNTER — Other Ambulatory Visit: Payer: Self-pay | Admitting: Family Medicine

## 2019-09-03 ENCOUNTER — Other Ambulatory Visit: Payer: Self-pay

## 2019-09-03 DIAGNOSIS — Z853 Personal history of malignant neoplasm of breast: Secondary | ICD-10-CM

## 2019-09-03 DIAGNOSIS — R519 Headache, unspecified: Secondary | ICD-10-CM

## 2019-10-30 ENCOUNTER — Encounter (INDEPENDENT_AMBULATORY_CARE_PROVIDER_SITE_OTHER): Payer: Medicare Other | Admitting: Ophthalmology

## 2019-10-30 DIAGNOSIS — H2513 Age-related nuclear cataract, bilateral: Secondary | ICD-10-CM

## 2019-10-30 DIAGNOSIS — H33301 Unspecified retinal break, right eye: Secondary | ICD-10-CM

## 2019-10-30 DIAGNOSIS — H43813 Vitreous degeneration, bilateral: Secondary | ICD-10-CM | POA: Diagnosis not present

## 2020-02-14 IMAGING — CT CT HEAD W/O CM
1 series · 16 of 28 positions shown, 20 images · non-contrast
Comparison: None.

CLINICAL DATA: Severe headache with nausea. History of breast
carcinoma

EXAM:
CT HEAD WITHOUT CONTRAST
TECHNIQUE: Contiguous axial images were obtained from the base of the skull
through the vertex without intravenous contrast.

[Series 2: head wo · axial · 0.41mm/px · z∈[-137,-12]mm · 16 of 28 slices shown, 20 images]
[im 2/28  brain]
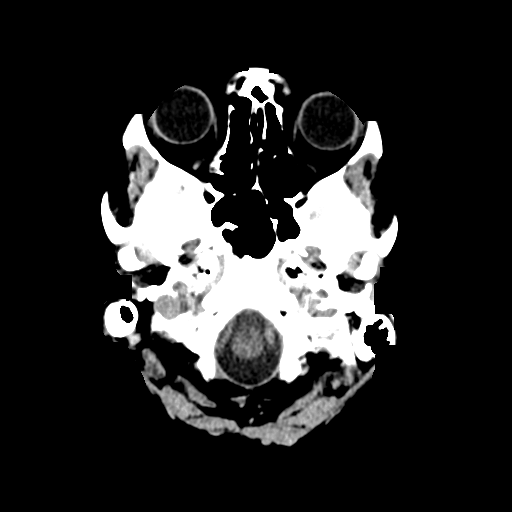
[im 2/28  bone]
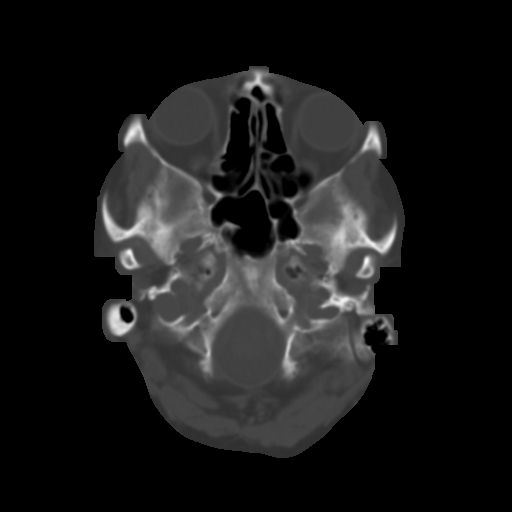
[im 4/28  brain]
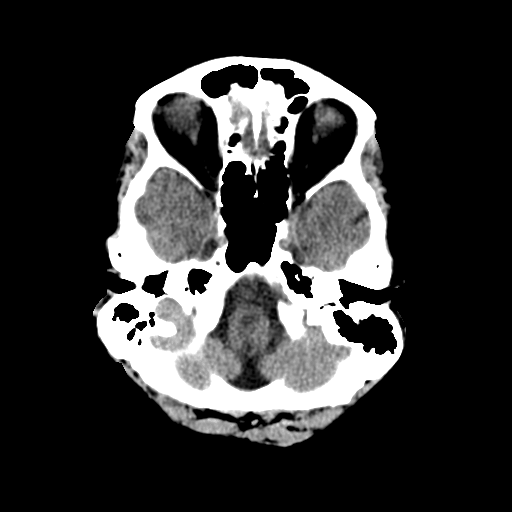
[im 6/28  brain]
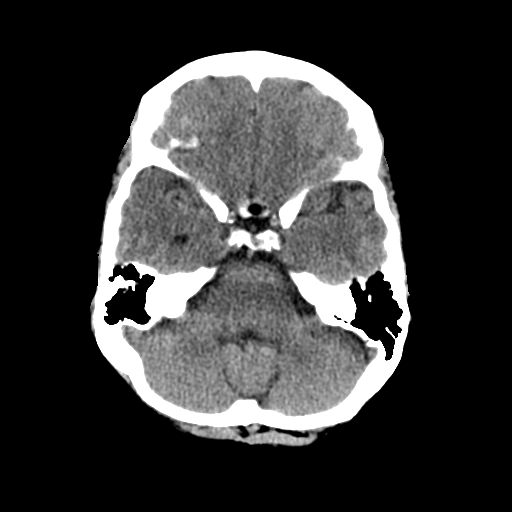
[im 7/28  brain]
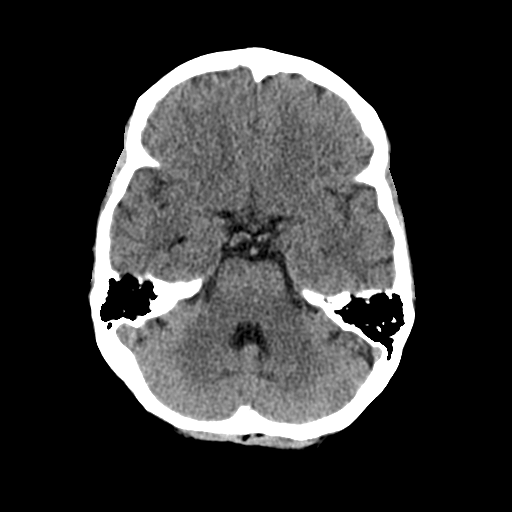
[im 9/28  brain]
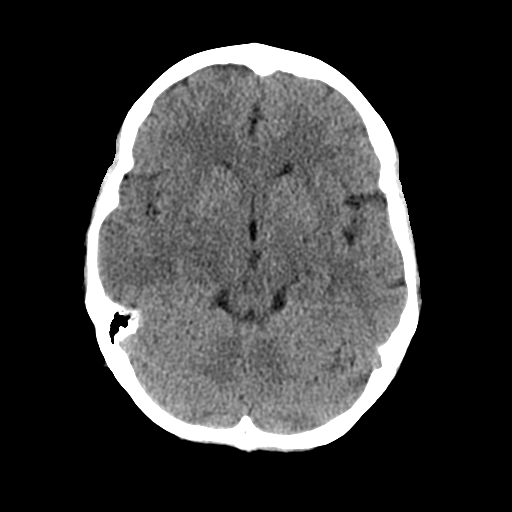
[im 9/28  bone]
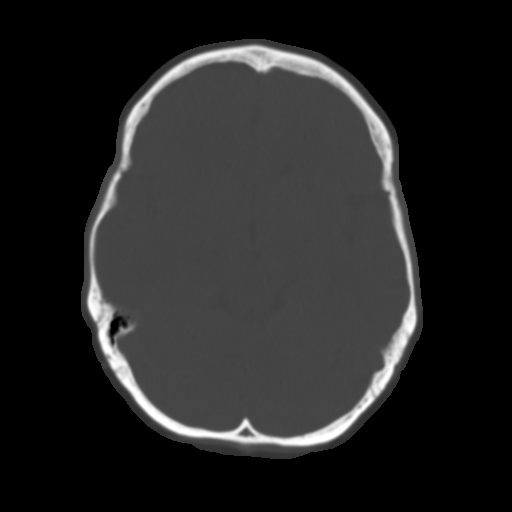
[im 10/28  brain]
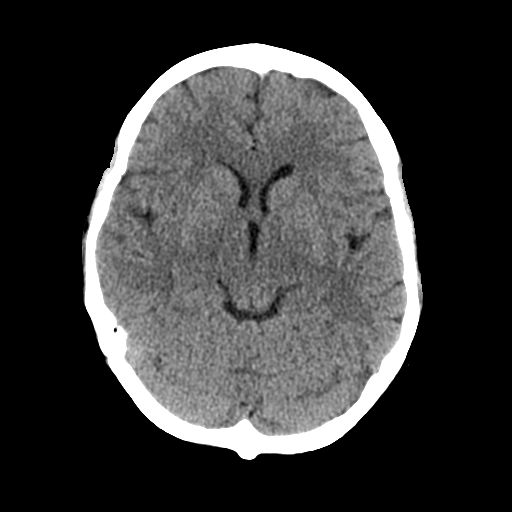
[im 12/28  brain]
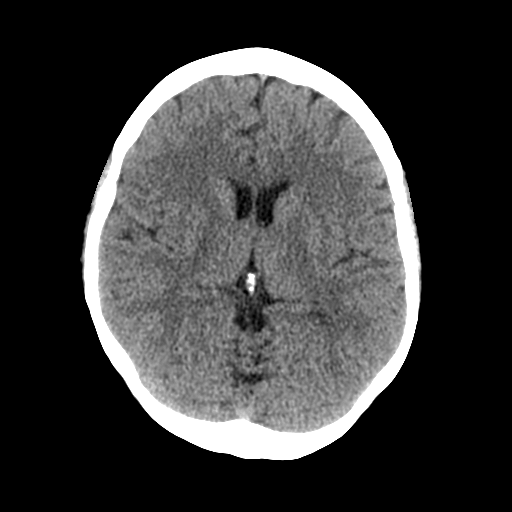
[im 14/28  brain]
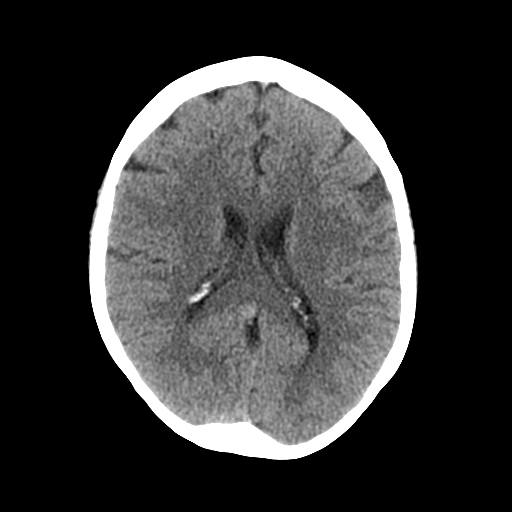
[im 15/28  brain]
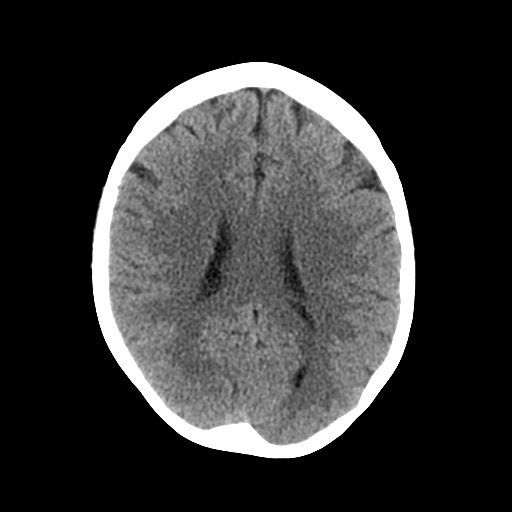
[im 15/28  bone]
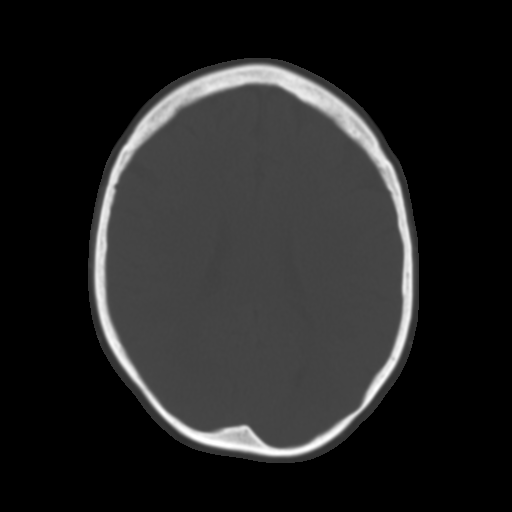
[im 17/28  brain]
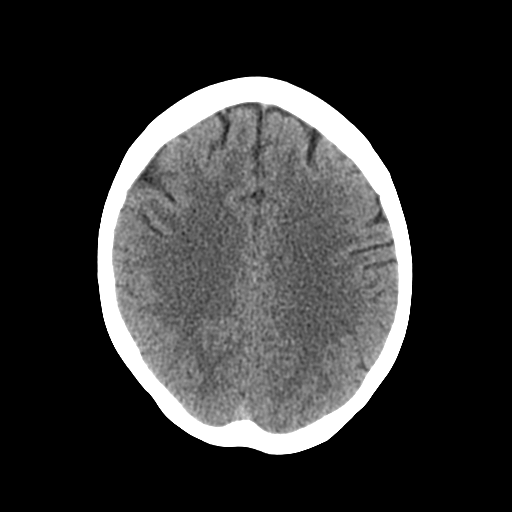
[im 19/28  brain]
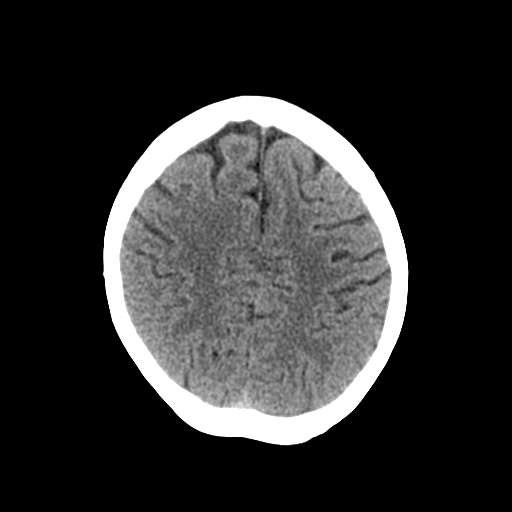
[im 20/28  brain]
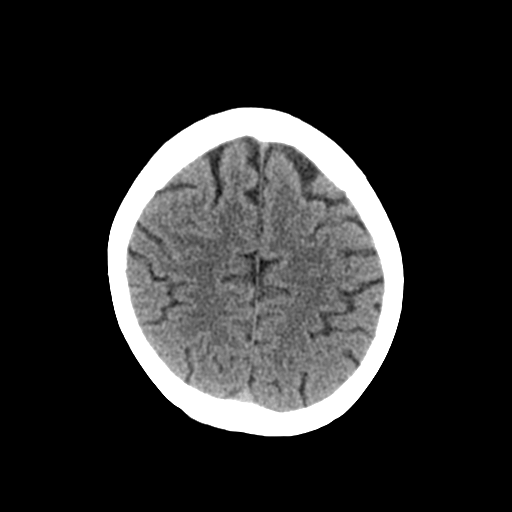
[im 22/28  brain]
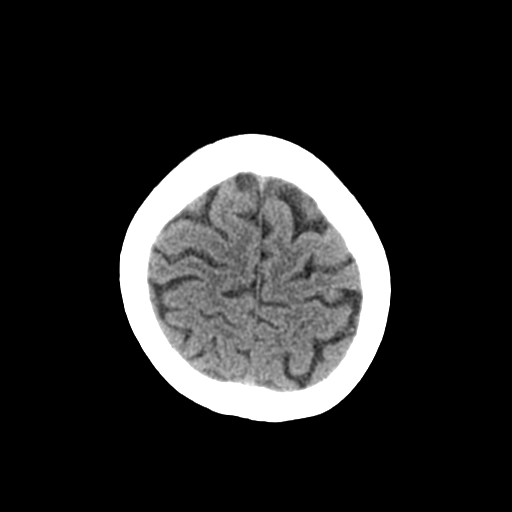
[im 22/28  bone]
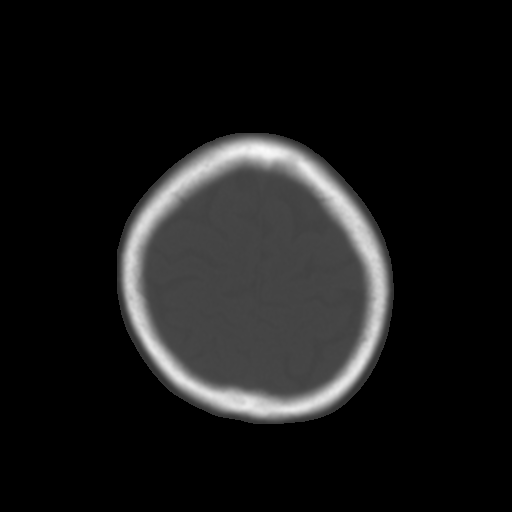
[im 23/28  brain]
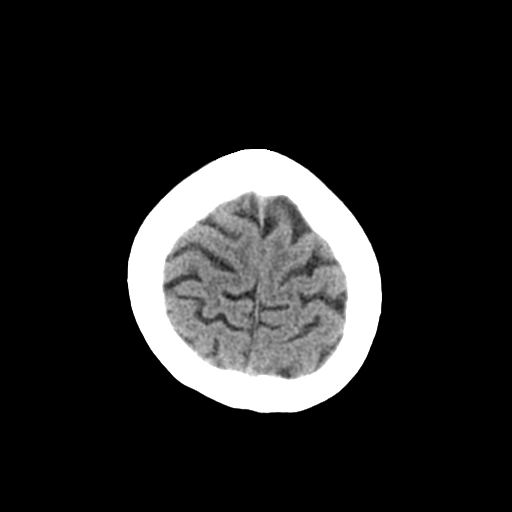
[im 25/28  brain]
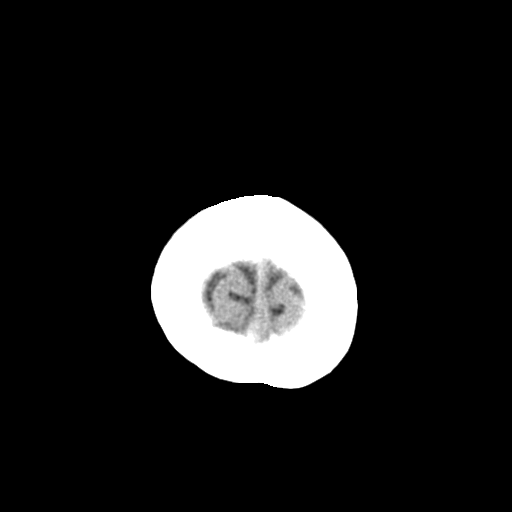
[im 27/28  brain]
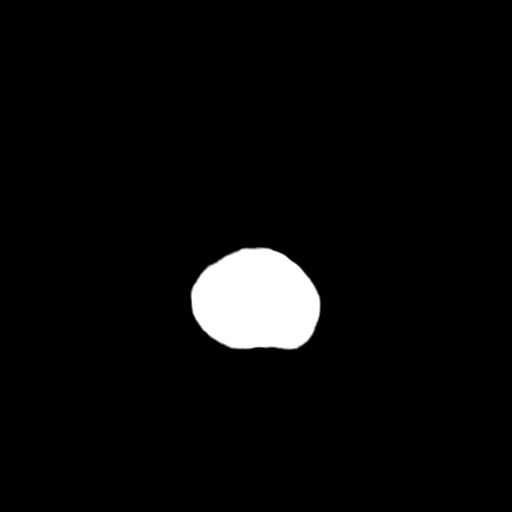

[16 of 28 positions shown; findings below may reference images not displayed]

FINDINGS: Brain: Ventricles and sulci are normal in size and configuration.
There is no intracranial mass, hemorrhage, extra-axial fluid
collection, or midline shift. Brain parenchyma appears unremarkable.
No evident acute infarct.

Vascular: No hyperdense vessel.  No vascular calcification evident.

Skull: Bony calvarium appears intact.

Sinuses/Orbits: There is mucosal thickening in several ethmoid air
cells. Other visualized paranasal sinuses are clear. Visualized
orbits appear symmetric bilaterally.

Other: Mastoid air cells are clear.
IMPRESSION: Mucosal thickening in several ethmoid air cells.

Study otherwise unremarkable.

These results will be called to the ordering clinician or
representative by the [HOSPITAL] at the imaging location.

## 2021-09-09 ENCOUNTER — Ambulatory Visit: Payer: Self-pay | Admitting: Surgery

## 2021-09-09 NOTE — H&P (Signed)
CC: Traumatic hematoma of buttock, initial encounter [S30.0XXA]  HPI: Ashley Hampton is a 66 y.o. adult who was referred by Glee Arvin* for evaluation of above. First noted several months ago. Symptoms include: Pain is dull, still present over palpable lump. Exacerbated by pressure. Alleviated by nothing specific. Associated with nothing specific. Lump was decreasing in size, but recently feels it has not been getting smaller.   Past Medical History: has a past medical history of Breast cancer (CMS-HCC), History of chemotherapy, History of radiation therapy, Hyperlipidemia, and Hypothyroidism.  Past Surgical History: has a past surgical history that includes egd (03/27/2007); Colonoscopy (03/27/2007); Colonoscopy (08/23/2010); Colonoscopy (08/17/2015); Mastectomy; and Colonoscopy (07/13/2021).  Family History: family history includes Arthritis in [place]. Halladay's mother; Lung cancer in [place]. Korinek's father and mother.  Social History: reports that [place]. Knippenberg has never smoked. Marcellina Millin. Gammell has never used smokeless tobacco. Marcellina Millin. Raju reports that [employer]. Bhullar does not currently use alcohol. Marcellina Millin. Teall reports that [employer]. Vanosdol does not currently use drugs.  Current Medications: has a current medication list which includes the following prescription(s): cetirizine hcl, cholecalciferol (vitamin d3), cyanocobalamin, fluticasone propionate, l.acid/b.animalis,bifidum/fos, magnesium oxide, polyethylene glycol, pravastatin, levothyroxine, montelukast, and nortriptyline.  Allergies:  Allergies  Allergen Reactions   Ampicillin Rash   ROS:  A 15 point review of systems was performed and pertinent positives and negatives noted in HPI  Objective:    BP 92/57   Pulse 80   Ht 165.1 cm (5\' 5" )   Wt 67.6 kg (149 lb)   BMI 24.79 kg/m   Constitutional : No distress, cooperative, alert  Lymphatics/Throat: Supple with no lymphadenopathy  Respiratory: Clear to auscultation  bilaterally  Cardiovascular: Regular rate and rhythm  Gastrointestinal: Soft, non-tender, non-distended, no organomegaly.  Musculoskeletal: Steady gait and movement  Skin: Cool and moist, left buttock with palpable lump slightly above linear skin indentation extending from cleft, consistent with [place] report of hematoma. Minimal TTP, and minimal dark skin discoloration in the area.  Psychiatric: Normal affect, non-agitated, not confused     LABS:  n/a   RADS: Procedure: US EXTREMITY NONVASCULAR LEFT LIMITED   Indication: 66 yo female with a firm non mobile indurated non fluctuant ara 10cm x 12 xcm of the left buttock needs evaluation. Possible hematoma., M79.89 Other specified soft tissue disorders   Comparison: None.    Technique: Gray-scale and color Doppler ultrasound images of the left and right lower extremities.    Findings: There is an elongated fluid collection in the left buttock measuring 12.3 x 1.75 cm, which is predominantly hyperechoic. There is no internal vascularity on color Doppler. There is a more focal area of echogenicity within the collection measuring 2.1 x 0.9 x 2.3 cm, favored to represent echogenic debris.    Comparison images of the right buttocks appear unremarkable.   Impression:   Elongated, predominantly hyperechoic fluid collection in the left buttock measuring 12.3 x 1.75 cm, with echogenic debris. This is most consistent with an evolving hematoma.   Electronically Reviewed by: Caroll Rancher, MD, [facility] Electronically Reviewed on: 08/10/2021 2:42 PM   I have reviewed the images and concur with the above findings.   Electronically Signed by: Clemens Catholic, MD, [facility] Electronically Signed on: 08/10/2021 2:48 PM  Assessment:    Traumatic hematoma of buttock, initial encounter [S30.0XXA] Symptomatic and still present close to 6months after injury, recommend evacuation in OR at this point.  Plan:    1.  Traumatic hematoma of  buttock, initial encounter [S30.0XXA] Discussed surgical excision. Alternatives include continued observation. Benefits include possible symptom relief, pathologic evaluation, improved cosmesis. Discussed the risk of surgery including recurrence, chronic pain, post-op infxn, poor cosmesis, poor/delayed wound healing, and possible re-operation to address said risks. The risks of general anesthetic, if used, includes MI, CVA, sudden death or even reaction to anesthetic medications also discussed.  Typical post-op recovery time of 3-5 days with possible activity restrictions were also discussed.  The patient verbalized understanding and all questions were answered to the patient's satisfaction.  2. Patient has elected to proceed with surgical treatment.

## 2021-09-09 NOTE — H&P (View-Only) (Signed)
CC: Traumatic hematoma of buttock, initial encounter [S30.0XXA]  HPI: Ashley Hampton is a 66 y.o. adult who was referred by Glee Arvin* for evaluation of above. First noted several months ago. Symptoms include: Pain is dull, still present over palpable lump. Exacerbated by pressure. Alleviated by nothing specific. Associated with nothing specific. Lump was decreasing in size, but recently feels it has not been getting smaller.   Past Medical History: has a past medical history of Breast cancer (CMS-HCC), History of chemotherapy, History of radiation therapy, Hyperlipidemia, and Hypothyroidism.  Past Surgical History: has a past surgical history that includes egd (03/27/2007); Colonoscopy (03/27/2007); Colonoscopy (08/23/2010); Colonoscopy (08/17/2015); Mastectomy; and Colonoscopy (07/13/2021).  Family History: family history includes Arthritis in [place]. Watrous's mother; Lung cancer in [place]. Salinas's father and mother.  Social History: reports that [place]. Welford has never smoked. Marcellina Millin. Craigo has never used smokeless tobacco. Marcellina Millin. Artiga reports that [employer]. Gilpin does not currently use alcohol. Marcellina Millin. Deiter reports that [employer]. Grennan does not currently use drugs.  Current Medications: has a current medication list which includes the following prescription(s): cetirizine hcl, cholecalciferol (vitamin d3), cyanocobalamin, fluticasone propionate, l.acid/b.animalis,bifidum/fos, magnesium oxide, polyethylene glycol, pravastatin, levothyroxine, montelukast, and nortriptyline.  Allergies:  Allergies  Allergen Reactions   Ampicillin Rash   ROS:  A 15 point review of systems was performed and pertinent positives and negatives noted in HPI  Objective:    BP 92/57   Pulse 80   Ht 165.1 cm (5\' 5" )   Wt 67.6 kg (149 lb)   BMI 24.79 kg/m   Constitutional : No distress, cooperative, alert  Lymphatics/Throat: Supple with no lymphadenopathy  Respiratory: Clear to auscultation  bilaterally  Cardiovascular: Regular rate and rhythm  Gastrointestinal: Soft, non-tender, non-distended, no organomegaly.  Musculoskeletal: Steady gait and movement  Skin: Cool and moist, left buttock with palpable lump slightly above linear skin indentation extending from cleft, consistent with [place] report of hematoma. Minimal TTP, and minimal dark skin discoloration in the area.  Psychiatric: Normal affect, non-agitated, not confused     LABS:  n/a   RADS: Procedure: US EXTREMITY NONVASCULAR LEFT LIMITED   Indication: 66 yo female with a firm non mobile indurated non fluctuant ara 10cm x 12 xcm of the left buttock needs evaluation. Possible hematoma., M79.89 Other specified soft tissue disorders   Comparison: None.    Technique: Gray-scale and color Doppler ultrasound images of the left and right lower extremities.    Findings: There is an elongated fluid collection in the left buttock measuring 12.3 x 1.75 cm, which is predominantly hyperechoic. There is no internal vascularity on color Doppler. There is a more focal area of echogenicity within the collection measuring 2.1 x 0.9 x 2.3 cm, favored to represent echogenic debris.    Comparison images of the right buttocks appear unremarkable.   Impression:   Elongated, predominantly hyperechoic fluid collection in the left buttock measuring 12.3 x 1.75 cm, with echogenic debris. This is most consistent with an evolving hematoma.   Electronically Reviewed by: Caroll Rancher, MD, [facility] Electronically Reviewed on: 08/10/2021 2:42 PM   I have reviewed the images and concur with the above findings.   Electronically Signed by: Clemens Catholic, MD, [facility] Electronically Signed on: 08/10/2021 2:48 PM  Assessment:    Traumatic hematoma of buttock, initial encounter [S30.0XXA] Symptomatic and still present close to 56months after injury, recommend evacuation in OR at this point.  Plan:    1.  Traumatic hematoma of  buttock, initial encounter [S30.0XXA] Discussed surgical excision. Alternatives include continued observation. Benefits include possible symptom relief, pathologic evaluation, improved cosmesis. Discussed the risk of surgery including recurrence, chronic pain, post-op infxn, poor cosmesis, poor/delayed wound healing, and possible re-operation to address said risks. The risks of general anesthetic, if used, includes MI, CVA, sudden death or even reaction to anesthetic medications also discussed.  Typical post-op recovery time of 3-5 days with possible activity restrictions were also discussed.  The patient verbalized understanding and all questions were answered to the patient's satisfaction.  2. Patient has elected to proceed with surgical treatment.

## 2021-09-13 ENCOUNTER — Encounter
Admission: RE | Admit: 2021-09-13 | Discharge: 2021-09-13 | Disposition: A | Discharge status: Home / Self Care | Payer: Medicare Other | Source: Ambulatory Visit | Attending: Surgery | Admitting: Surgery

## 2021-09-13 ENCOUNTER — Other Ambulatory Visit: Payer: Self-pay

## 2021-09-13 NOTE — Patient Instructions (Signed)
Your procedure is scheduled on: 09/22/21 Report to [facility]. To find out your arrival time please call [phone] between 1PM - 3PM on 09/21/21.  Remember: Instructions that are not followed completely may result in serious medical risk, up to and including death, or upon the discretion of your surgeon and anesthesiologist your surgery may need to be rescheduled.     _X__ 1. Do not eat food after midnight the night before your procedure.                 No gum chewing or hard candies. You may drink clear liquids up to 2 hours                 before you are scheduled to arrive for your surgery- DO not drink clear                 liquids within 2 hours of the start of your surgery.                 Clear Liquids include:  water, apple juice without pulp, clear carbohydrate                 drink such as Clearfast or Gatorade, Black Coffee or Tea (Do not add                 anything to coffee or tea). Diabetics water only  __X__2.  On the morning of surgery brush your teeth with toothpaste and water, you                 may rinse your mouth with mouthwash if you wish.  Do not swallow any              toothpaste of mouthwash.     _X__ 3.  No Alcohol for 24 hours before or after surgery.   _X__ 4.  Do Not Smoke or use e-cigarettes For 24 Hours Prior to Your Surgery.                 Do not use any chewable tobacco products for at least 6 hours prior to                 surgery.  ____  5.  Bring all medications with you on the day of surgery if instructed.   __X__  6.  Notify your doctor if there is any change in your medical condition      (cold, fever, infections).     Do not wear jewelry, make-up, hairpins, clips or nail polish. Do not wear lotions, powders, or perfumes.  Do not shave body hair 48 hours prior to surgery. Men may shave face and neck. Do not bring valuables to the hospital.    [facility] is not responsible for any  belongings or valuables.  Contacts, dentures/partials or body piercings may not be worn into surgery. Bring a case for your contacts, glasses or hearing aids, a denture cup will be supplied. Leave your suitcase in the car. After surgery it may be brought to your room. For patients admitted to the hospital, discharge time is determined by your treatment team.   Patients discharged the day of surgery will not be allowed to drive home.   Please read over the following fact sheets that you were given:   CHG soap  __X__ Take these medicines the morning of surgery with A SIP OF WATER:  1. cetirizine (ZYRTEC) 10 MG tablet  2. levothyroxine (SYNTHROID, LEVOTHROID) 75 MCG tablet  3.   4.  5.  6.  ____ Fleet Enema (as directed)   __X__ Use CHG Soap/SAGE wipes as directed  ____ Use inhalers on the day of surgery  ____ Stop metformin/Janumet/Farxiga 2 days prior to surgery    ____ Take 1/2 of usual insulin dose the night before surgery. No insulin the morning          of surgery.   ____ Stop Blood Thinners Coumadin/Plavix/Xarelto/Pleta/Pradaxa/Eliquis/Effient/Aspirin  on   Or contact your Surgeon, Cardiologist or Medical Doctor regarding  ability to stop your blood thinners  __X__ Stop Anti-inflammatories 7 days before surgery such as Advil, Ibuprofen, Motrin,  BC or Goodies Powder, Naprosyn, Naproxen, Aleve, Aspirin    __X__ Stop all herbals and supplements, fish oil or vitamins for 7 days until after surgery.    ____ Bring C-Pap to the hospital.

## 2021-09-13 NOTE — Progress Notes (Signed)
Perioperative Services Pre-Admission/Anesthesia Testing   Date: 09/13/21 Name: Ashley Hampton MRN:   [id number]  Re: Consideration of preoperative prophylactic antibiotic change   Request sent to: Benjamine Sprague, DO (routed and/or faxed via [facility])  Planned Surgical Procedure(s):    Case: [id number] Date/Time: 09/22/21 0715   Procedure: INCISION AND DRAINAGE ABSCESS (Left)   Anesthesia type: General   Pre-op diagnosis: Traumatic hematoma of buttock, initial encounter S30.0XXA   Location: [place] OR ROOM 06 / [place] [facility]   Surgeons: Benjamine Sprague, DO   Clinical Notes:  Patient has a documented allergy to AMPICILLIN  Advising that this medication has caused her to experience an urticarial rash in the past.   Received PCN with no documented complications Received AUGMENTIN on 06/05/2021 with no documented ADRs.  Screened as appropriate for cephalosporin use during medication reconciliation No immediate angioedema, dysphagia, SOB, anaphylaxis symptoms. No severe rash involving mucous membranes or skin necrosis. No hospital admissions related to side effects of PCN/cephalosporin use.  No documented reaction to PCN or cephalosporin in the last 10 years.  Request:  As an evidence based approach to reducing the rate of incidence for post-operative SSI and the development of MDROs, could an agent with narrower coverage for preoperative prophylaxis in this patient's upcoming surgical course be considered?   Currently ordered preoperative prophylactic ABX: clindamycin.   Specifically requesting change to cephalosporin (CEFAZOLIN).   Please communicate decision with me and I will change the orders in Epic as per your direction.   Things to consider: Many patients report that they were "allergic" to PCN earlier in life, however this does not translate into a true lifelong allergy. Patients can lose sensitivity to specific IgE antibodies over time if PCN is avoided (Kleris & Lugar,  2019).  Up to 10% of the adult population and 15% of hospitalized patients report an allergy to PCN, however clinical studies suggest that 90% of those reporting an allergy can tolerate PCN antibiotics (Kleris & Lugar, 2019).  Cross-sensitivity between PCN and cephalosporins has been documented as being as high as 10%, however this estimation included data believed to have been collected in a setting where there was contamination. Newer data suggests that the prevalence of cross-sensitivity between PCN and cephalosporins is actually estimated to be closer to 1% (Hermanides et al., 2018).   Patients labeled as PCN allergic, whether they are truly allergic or not, have been found to have inferior outcomes in terms of rates of serious infection, and these patients tend to have longer hospital stays ([place], 2019).  Treatment related secondary infections, such as Clostridioides difficile, have been linked to the improper use of broad spectrum antibiotics in patients improperly labeled as PCN allergic (Kleris & Lugar, 2019).  Anaphylaxis from cephalosporins is rare and the evidence suggests that there is no increased risk of an anaphylactic type reaction when cephalosporins are used in a PCN allergic patient (Pichichero, 2006).  Citations: Hermanides J, Lemkes BA, Prins Pearla Dubonnet MW, Terreehorst I. Presumed ?-Lactam Allergy and Cross-reactivity in the Operating Theater: A Practical Approach. Anesthesiology. 2018 Aug;129(2):335-342. doi: 10.1097/ALN.0000000000002252. PMID: [id number].  Kleris, [place]., & Lugar, P. L. (2019). Things We Do For No Reason: Failing to Question a Penicillin Allergy History. Journal of hospital medicine, 14(10), [phone]. Advance online publication. https://www.wallace-middleton.info/  Pichichero, M. E. (2006). Cephalosporins can be prescribed safely for penicillin-allergic patients. Journal of family medicine, 55(2), 106-112. Accessed:  https://cdn.mdedge.com/files/s20fs-public/Document/September-2017/5502JFP_AppliedEvidence1.pdf   Honor Loh, MSN, APRN, FNP-C, CEN [facility]  Peri-operative Services Nurse Practitioner FAX: [phone] 09/13/21 12:39 PM

## 2021-09-14 ENCOUNTER — Other Ambulatory Visit
Admission: RE | Admit: 2021-09-14 | Discharge: 2021-09-14 | Disposition: A | Discharge status: Home / Self Care | Payer: Medicare Other | Source: Ambulatory Visit | Attending: Surgery | Admitting: Surgery

## 2021-09-14 DIAGNOSIS — E785 Hyperlipidemia, unspecified: Secondary | ICD-10-CM | POA: Insufficient documentation

## 2021-09-14 DIAGNOSIS — Z01810 Encounter for preprocedural cardiovascular examination: Secondary | ICD-10-CM | POA: Insufficient documentation

## 2021-09-21 MED ORDER — APREPITANT 40 MG PO CAPS: 40.0000 mg | ORAL_CAPSULE | Freq: Once | ORAL | Status: AC

## 2021-09-21 MED ORDER — CLINDAMYCIN PHOSPHATE 900 MG/50ML IV SOLN
900.0000 mg | INTRAVENOUS | Status: AC
  Administered 2021-09-22: 900 mg via INTRAVENOUS

## 2021-09-21 MED ORDER — FAMOTIDINE 20 MG PO TABS: 20.0000 mg | ORAL_TABLET | Freq: Once | ORAL | Status: AC

## 2021-09-21 MED ORDER — LACTATED RINGERS IV SOLN: INTRAVENOUS | Status: DC

## 2021-09-21 MED ORDER — CHLORHEXIDINE GLUCONATE 0.12 % MT SOLN: 15.0000 mL | Freq: Once | OROMUCOSAL | Status: AC

## 2021-09-21 MED ORDER — ORAL CARE MOUTH RINSE: 15.0000 mL | Freq: Once | OROMUCOSAL | Status: AC

## 2021-09-21 MED ORDER — CHLORHEXIDINE GLUCONATE CLOTH 2 % EX PADS: 6.0000 | MEDICATED_PAD | Freq: Once | CUTANEOUS | Status: DC

## 2021-09-22 ENCOUNTER — Encounter
Admission: RE | Disposition: A | Discharge status: Home / Self Care | Payer: Self-pay | Source: Home / Self Care | Attending: Surgery

## 2021-09-22 ENCOUNTER — Other Ambulatory Visit: Payer: Self-pay

## 2021-09-22 ENCOUNTER — Ambulatory Visit: Payer: Medicare Other | Admitting: Anesthesiology

## 2021-09-22 ENCOUNTER — Ambulatory Visit
Admission: RE | Admit: 2021-09-22 | Discharge: 2021-09-22 | Disposition: A | Discharge status: Home / Self Care | Payer: Medicare Other | Attending: Surgery | Admitting: Surgery

## 2021-09-22 DIAGNOSIS — M7989 Other specified soft tissue disorders: Secondary | ICD-10-CM | POA: Insufficient documentation

## 2021-09-22 DIAGNOSIS — T148XXA Other injury of unspecified body region, initial encounter: Secondary | ICD-10-CM

## 2021-09-22 DIAGNOSIS — S300XXA Contusion of lower back and pelvis, initial encounter: Secondary | ICD-10-CM | POA: Insufficient documentation

## 2021-09-22 DIAGNOSIS — X58XXXA Exposure to other specified factors, initial encounter: Secondary | ICD-10-CM | POA: Insufficient documentation

## 2021-09-22 DIAGNOSIS — E039 Hypothyroidism, unspecified: Secondary | ICD-10-CM | POA: Diagnosis not present

## 2021-09-22 HISTORY — PX: INCISION AND DRAINAGE ABSCESS: SHX5864

## 2021-09-22 SURGERY — INCISION AND DRAINAGE, ABSCESS
Anesthesia: General | Laterality: Left

## 2021-09-22 MED ORDER — ACETAMINOPHEN 10 MG/ML IV SOLN
INTRAVENOUS | Status: DC | PRN
Start: 2021-09-22 — End: 2021-09-22
  Administered 2021-09-22: 1000 mg via INTRAVENOUS

## 2021-09-22 MED ORDER — ACETAMINOPHEN 325 MG PO TABS: 650.0000 mg | ORAL_TABLET | Freq: Three times a day (TID) | ORAL | 0 refills | Status: AC | PRN

## 2021-09-22 MED ORDER — FENTANYL CITRATE (PF) 100 MCG/2ML IJ SOLN
INTRAMUSCULAR | Status: AC
  Administered 2021-09-22: 25 ug via INTRAVENOUS
  Filled 2021-09-22: qty 2

## 2021-09-22 MED ORDER — DOCUSATE SODIUM 100 MG PO CAPS: 100.0000 mg | ORAL_CAPSULE | Freq: Two times a day (BID) | ORAL | 0 refills | Status: AC | PRN

## 2021-09-22 MED ORDER — MIDAZOLAM HCL 2 MG/2ML IJ SOLN
INTRAMUSCULAR | Status: DC | PRN
  Administered 2021-09-22 (×2): 1 mg via INTRAVENOUS

## 2021-09-22 MED ORDER — DEXAMETHASONE SODIUM PHOSPHATE 10 MG/ML IJ SOLN
INTRAMUSCULAR | Status: AC
  Filled 2021-09-22: qty 1

## 2021-09-22 MED ORDER — LIDOCAINE HCL 1 % IJ SOLN
INTRAMUSCULAR | Status: DC | PRN
Start: 2021-09-22 — End: 2021-09-22
  Administered 2021-09-22: 2 mL

## 2021-09-22 MED ORDER — ONDANSETRON HCL 4 MG/2ML IJ SOLN
INTRAMUSCULAR | Status: AC
  Filled 2021-09-22: qty 2

## 2021-09-22 MED ORDER — FENTANYL CITRATE (PF) 100 MCG/2ML IJ SOLN
INTRAMUSCULAR | Status: AC
  Filled 2021-09-22: qty 2

## 2021-09-22 MED ORDER — FENTANYL CITRATE (PF) 100 MCG/2ML IJ SOLN
25.0000 ug | INTRAMUSCULAR | Status: DC | PRN
  Administered 2021-09-22 (×3): 25 ug via INTRAVENOUS

## 2021-09-22 MED ORDER — ONDANSETRON HCL 4 MG/2ML IJ SOLN
4.0000 mg | Freq: Once | INTRAMUSCULAR | Status: AC | PRN
  Administered 2021-09-22: 4 mg via INTRAVENOUS

## 2021-09-22 MED ORDER — ROCURONIUM BROMIDE 100 MG/10ML IV SOLN
INTRAVENOUS | Status: DC | PRN
Start: 2021-09-22 — End: 2021-09-22
  Administered 2021-09-22: 45 mg via INTRAVENOUS

## 2021-09-22 MED ORDER — FAMOTIDINE 20 MG PO TABS
ORAL_TABLET | ORAL | Status: AC
  Administered 2021-09-22: 20 mg via ORAL
  Filled 2021-09-22: qty 1

## 2021-09-22 MED ORDER — FENTANYL CITRATE (PF) 100 MCG/2ML IJ SOLN
INTRAMUSCULAR | Status: DC | PRN
  Administered 2021-09-22 (×2): 50 ug via INTRAVENOUS

## 2021-09-22 MED ORDER — ONDANSETRON HCL 4 MG/2ML IJ SOLN
INTRAMUSCULAR | Status: DC | PRN
  Administered 2021-09-22: 4 mg via INTRAVENOUS

## 2021-09-22 MED ORDER — MIDAZOLAM HCL 2 MG/2ML IJ SOLN
INTRAMUSCULAR | Status: AC
  Filled 2021-09-22: qty 2

## 2021-09-22 MED ORDER — ACETAMINOPHEN 10 MG/ML IV SOLN
INTRAVENOUS | Status: AC
  Filled 2021-09-22: qty 100

## 2021-09-22 MED ORDER — HYDROCODONE-ACETAMINOPHEN 5-325 MG PO TABS: 1.0000 | ORAL_TABLET | Freq: Four times a day (QID) | ORAL | 0 refills | Status: AC | PRN

## 2021-09-22 MED ORDER — PROPOFOL 10 MG/ML IV BOLUS
INTRAVENOUS | Status: DC | PRN
Start: 2021-09-22 — End: 2021-09-22
  Administered 2021-09-22: 60 mg via INTRAVENOUS

## 2021-09-22 MED ORDER — BUPIVACAINE-EPINEPHRINE (PF) 0.25% -1:200000 IJ SOLN
INTRAMUSCULAR | Status: AC
  Filled 2021-09-22: qty 30

## 2021-09-22 MED ORDER — SUGAMMADEX SODIUM 200 MG/2ML IV SOLN
INTRAVENOUS | Status: DC | PRN
  Administered 2021-09-22: 200 mg via INTRAVENOUS
  Administered 2021-09-22: 100 mg via INTRAVENOUS

## 2021-09-22 MED ORDER — CHLORHEXIDINE GLUCONATE 0.12 % MT SOLN
OROMUCOSAL | Status: AC
  Administered 2021-09-22: 15 mL via OROMUCOSAL
  Filled 2021-09-22: qty 15

## 2021-09-22 MED ORDER — APREPITANT 40 MG PO CAPS
ORAL_CAPSULE | ORAL | Status: AC
  Administered 2021-09-22: 40 mg via ORAL
  Filled 2021-09-22: qty 1

## 2021-09-22 MED ORDER — LIDOCAINE HCL (PF) 2 % IJ SOLN
INTRAMUSCULAR | Status: AC
  Filled 2021-09-22: qty 5

## 2021-09-22 MED ORDER — LIDOCAINE HCL (PF) 1 % IJ SOLN
INTRAMUSCULAR | Status: AC
  Filled 2021-09-22: qty 30

## 2021-09-22 MED ORDER — BUPIVACAINE-EPINEPHRINE 0.25% -1:200000 IJ SOLN
INTRAMUSCULAR | Status: DC | PRN
Start: 2021-09-22 — End: 2021-09-22
  Administered 2021-09-22: 2 mL

## 2021-09-22 MED ORDER — CLINDAMYCIN PHOSPHATE 900 MG/50ML IV SOLN
INTRAVENOUS | Status: AC
Start: 2021-09-22 — End: 2021-09-22
  Filled 2021-09-22: qty 50

## 2021-09-22 MED ORDER — DEXAMETHASONE SODIUM PHOSPHATE 10 MG/ML IJ SOLN
INTRAMUSCULAR | Status: DC | PRN
Start: 2021-09-22 — End: 2021-09-22
  Administered 2021-09-22: 10 mg via INTRAVENOUS

## 2021-09-22 MED ORDER — PHENYLEPHRINE 40 MCG/ML (10ML) SYRINGE FOR IV PUSH (FOR BLOOD PRESSURE SUPPORT)
PREFILLED_SYRINGE | INTRAVENOUS | Status: DC | PRN
  Administered 2021-09-22: 160 ug via INTRAVENOUS
  Administered 2021-09-22: 80 ug via INTRAVENOUS

## 2021-09-22 MED ORDER — PHENYLEPHRINE HCL-NACL 20-0.9 MG/250ML-% IV SOLN
INTRAVENOUS | Status: AC
  Filled 2021-09-22: qty 250

## 2021-09-22 MED ORDER — PROPOFOL 10 MG/ML IV BOLUS
INTRAVENOUS | Status: AC
  Filled 2021-09-22: qty 20

## 2021-09-22 SURGICAL SUPPLY — 37 items
ADH SKN CLS APL DERMABOND .7 (GAUZE/BANDAGES/DRESSINGS) ×1
APL PRP STRL LF DISP 70% ISPRP (MISCELLANEOUS) ×1
BLADE SURG 15 STRL LF DISP TIS (BLADE) ×1 IMPLANT
BLADE SURG 15 STRL SS (BLADE) ×2
BULB RESERV EVAC DRAIN JP 100C (MISCELLANEOUS) ×1 IMPLANT
CHLORAPREP W/TINT 26 (MISCELLANEOUS) ×2 IMPLANT
DERMABOND ADVANCED (GAUZE/BANDAGES/DRESSINGS) ×1
DERMABOND ADVANCED .7 DNX12 (GAUZE/BANDAGES/DRESSINGS) IMPLANT
DRAIN JP 10F RND SILICONE (MISCELLANEOUS) ×1 IMPLANT
DRAPE LAPAROTOMY 100X77 ABD (DRAPES) ×2 IMPLANT
DRSG TELFA 4X3 1S NADH ST (GAUZE/BANDAGES/DRESSINGS) ×1 IMPLANT
ELECT REM PT RETURN 9FT ADLT (ELECTROSURGICAL) ×2
ELECTRODE REM PT RTRN 9FT ADLT (ELECTROSURGICAL) ×1 IMPLANT
GAUZE 4X4 16PLY ~~LOC~~+RFID DBL (SPONGE) ×1 IMPLANT
GAUZE PACKING IODOFORM 1/2 (PACKING) IMPLANT
GAUZE SPONGE 4X4 12PLY STRL (GAUZE/BANDAGES/DRESSINGS) ×1 IMPLANT
GLOVE SURG SYN 6.5 ES PF (GLOVE) ×6 IMPLANT
GLOVE SURG SYN 6.5 PF PI (GLOVE) ×2 IMPLANT
GLOVE SURG UNDER POLY LF SZ7 (GLOVE) ×4 IMPLANT
GOWN STRL REUS W/ TWL LRG LVL3 (GOWN DISPOSABLE) ×2 IMPLANT
GOWN STRL REUS W/TWL LRG LVL3 (GOWN DISPOSABLE) ×6
LABEL OR SOLS (LABEL) ×2 IMPLANT
MANIFOLD NEPTUNE II (INSTRUMENTS) ×2 IMPLANT
NEEDLE HYPO 22GX1.5 SAFETY (NEEDLE) ×2 IMPLANT
NS IRRIG 500ML POUR BTL (IV SOLUTION) ×2 IMPLANT
PACK BASIN MINOR ARMC (MISCELLANEOUS) ×2 IMPLANT
SUT ETHILON 3-0 FS-10 30 BLK (SUTURE) ×2
SUT MNCRL 4-0 (SUTURE) ×2
SUT MNCRL 4-0 27XMFL (SUTURE) ×1
SUT VIC AB 2-0 CT2 27 (SUTURE) ×1 IMPLANT
SUT VIC AB 3-0 SH 27 (SUTURE) ×2
SUT VIC AB 3-0 SH 27X BRD (SUTURE) ×1 IMPLANT
SUTURE EHLN 3-0 FS-10 30 BLK (SUTURE) IMPLANT
SUTURE MNCRL 4-0 27XMF (SUTURE) ×1 IMPLANT
SYR 10ML LL (SYRINGE) ×2 IMPLANT
TOWEL OR 17X26 4PK STRL BLUE (TOWEL DISPOSABLE) ×2 IMPLANT
WATER STERILE IRR 500ML POUR (IV SOLUTION) ×1 IMPLANT

## 2021-09-22 NOTE — Discharge Instructions (Addendum)
Removal, Care After This sheet gives you information about how to care for yourself after your procedure. Your health care provider may also give you more specific instructions. If you have problems or questions, contact your health care provider. What can I expect after the procedure? After the procedure, it is common to have: Soreness. Bruising. Itching. Follow these instructions at home: site care Follow instructions from your health care provider about how to take care of your site. Make sure you: Wash your hands with soap and water before and after you change your bandage (dressing). If soap and water are not available, use hand sanitizer. Leave stitches (sutures), skin glue, or adhesive strips in place. These skin closures may need to stay in place for 2 weeks or longer. If adhesive strip edges start to loosen and curl up, you may trim the loose edges. Do not remove adhesive strips completely unless your health care provider tells you to do that. If the area bleeds or bruises, apply gentle pressure for 10 minutes. OK TO SHOWER IN 48HRS Measure drain output as instructed by RN.  Ok to change dressing around drain in 48hrs.  Keep gauze around drain site and and secure with tape afterwards.  Change daily  Check your site every day for signs of infection. Check for: Redness, swelling, or pain. Fluid or blood. Warmth. Pus or a bad smell.  General instructions Rest and then return to your normal activities as told by your health care provider.  tylenol and advil as needed for discomfort.  Please alternate between the two every four hours as needed for pain.    Use narcotics, if prescribed, only when tylenol and motrin is not enough to control pain.  325-650mg  every 8hrs to max of 3000mg /24hrs (including the 325mg  in every norco dose) for the tylenol.    Advil up to 800mg  per dose every 8hrs as needed for pain.   Keep all follow-up visits as told by your health care provider. This is  important. Contact a health care provider if: You have redness, swelling, or pain around your site. You have fluid or blood coming from your site. Your site feels warm to the touch. You have pus or a bad smell coming from your site. You have a fever. Your sutures, skin glue, or adhesive strips loosen or come off sooner than expected. Get help right away if: You have bleeding that does not stop with pressure or a dressing. Summary After the procedure, it is common to have some soreness, bruising, and itching at the site. Follow instructions from your health care provider about how to take care of your site. Check your site every day for signs of infection. Contact a health care provider if you have redness, swelling, or pain around your site, or your site feels warm to the touch. Keep all follow-up visits as told by your health care provider. This is important. This information is not intended to replace advice given to you by your health care provider. Make sure you discuss any questions you have with your health care provider. Document Released: 08/20/2015 Document Revised: 01/21/2018 Document Reviewed: 01/21/2018 Elsevier Interactive Patient Education  2019 [place]   The drugs that you were given will stay in your system until tomorrow so for the next 24 hours you should not:  Drive an automobile Make any legal decisions Drink any alcoholic beverage   You may resume regular meals tomorrow.  Today it is better to  start with liquids and gradually work up to solid foods.  You may eat anything you prefer, but it is better to start with liquids, then soup and crackers, and gradually work up to solid foods.   Please notify your doctor immediately if you have any unusual bleeding, trouble breathing, redness and pain at the surgery site, drainage, fever, or pain not relieved by medication.    Additional  Instructions:        Please contact your physician with any problems or Same Day Surgery at [phone], Monday through Friday 6 am to 4 pm, or  at [facility] number at [phone].

## 2021-09-22 NOTE — Op Note (Signed)
Pre-Op Dx: Left gluteal hematoma Post-Op Dx: Same Anesthesia: GETA EBL: Minimal Complications:  none apparent Specimen: Hematoma Procedure: excisional biopsy left gluteal hematoma Surgeon: Lysle Pearl  Indications for procedure: See H&P  Description of Procedure:  Consent obtained, time out performed.  Patient placed in prone position.  Area sterilized and draped in usual position.  Local infused to area previously marked.  10 cm incision made through dermis with 15blade and hematoma capsule noted in subcutaneous layer extending to the actual fascial layer.  No actual hematoma was present within the capsule.  To prevent future recurrence or persistent fluid accumulation, decision was made to proceed with removal of the remaining capsule within the are.  Tissue completely removed using electrocautery, passed off field pending pathology.  Wound hemostasis noted, 10 French round drain placed within the cavity and secured to the skin using 3-0 nylon.  Wound irrigated and then closed in two layer fashion with 3-0 vicryl in interrupted fashion for deep dermal layer, then running 4-0 monocryl in subcuticular fashion for epidermal layer.  Wound then dressed with dermabond.  Drain site covered with gauze and secured with paper tape.  Pt tolerated procedure well, and transferred to PACU in stable condition. Sponge and instrument count correct at end of procedure.

## 2021-09-22 NOTE — Anesthesia Preprocedure Evaluation (Signed)
Anesthesia Evaluation  Patient identified by MRN, date of birth, ID band Patient awake    Reviewed: Allergy & Precautions, NPO status , Patient's Chart, lab work & pertinent test results  History of Anesthesia Complications (+) PONV and history of anesthetic complications  Airway Mallampati: II  TM Distance: >3 FB Neck ROM: Full    Dental  (+) Teeth Intact   Pulmonary neg pulmonary ROS,    Pulmonary exam normal breath sounds clear to auscultation       Cardiovascular Exercise Tolerance: Good negative cardio ROS Normal cardiovascular exam Rhythm:Regular     Neuro/Psych negative neurological ROS  negative psych ROS   GI/Hepatic negative GI ROS, Neg liver ROS,   Endo/Other  negative endocrine ROSHypothyroidism   Renal/GU negative Renal ROS  negative genitourinary   Musculoskeletal  (+) Arthritis ,   Abdominal Normal abdominal exam  (+)   Peds negative pediatric ROS (+)  Hematology negative hematology ROS (+)   Anesthesia Other Findings Past Medical History: No date: AR (allergic rhinitis) No date: Breast cancer (HCC)     Comment:  h/o- infiltrating ductal carcinoma No date: DJD (degenerative joint disease) No date: DJD (degenerative joint disease) No date: Hypercholesteremia No date: Hypoglycemia No date: Hypothyroid No date: Menopausal state No date: Multinodular goiter No date: Osteopenia No date: PONV (postoperative nausea and vomiting)     Comment:  after lumpectomy.  Has been pre-medicated since then No date: Seasonal allergies No date: Severe dysplasia of cervix (CIN III) 05/20/2015: SUI (stress urinary incontinence, female) No date: Vaginal atrophy No date: Vitamin D deficiency  Past Surgical History: No date: BREAST LUMPECTOMY 2010: breast reconstruction; Right No date: CERVICAL BIOPSY  W/ LOOP ELECTRODE EXCISION No date: CERVICAL CONE BIOPSY No date: CESAREAN SECTION 09/02/2015:  ETHMOIDECTOMY; Bilateral     Comment:  Procedure:  TOTAL ETHMOIDECTOMY;  Surgeon: Margaretha Sheffield,              MD;  Location: [place];  Service: ENT;                Laterality: Bilateral; 07/02/2017: HYSTEROSCOPY WITH D & C; N/A     Comment:  Procedure: DILATATION AND CURETTAGE /HYSTEROSCOPY;                Surgeon: Brayton Mars, MD;  Location: [facility];               Service: Gynecology;  Laterality: N/A; 09/02/2015: IMAGE GUIDED SINUS SURGERY; N/A     Comment:  Procedure: IMAGE GUIDED SINUS SURGERY;  Surgeon: Margaretha Sheffield, MD;  Location: [place];  Service:               ENT;  Laterality: N/A;  gave disk to cece  07/02/2017: LAPAROSCOPIC BILATERAL SALPINGO OOPHERECTOMY; Bilateral     Comment:  Procedure: [place];              Surgeon: Brayton Mars, MD;  Location: [facility];               Service: Gynecology;  Laterality: Bilateral; 2010: MASTECTOMY; Right     Comment:  total - infiltrating ductal carcinoma 09/02/2015: MAXILLARY ANTROSTOMY; Bilateral     Comment:  Procedure: ENDOSCOPIC MAXILLARY ANTROSTOMY ;  Surgeon:               Margaretha Sheffield, MD;  Location: [place];  Service: ENT;  Laterality: Bilateral; 09/02/2015: SEPTOPLASTY; N/A     Comment:  Procedure: SEPTOPLASTY;  Surgeon: Margaretha Sheffield, MD;                Location: [place];  Service: ENT;                Laterality: N/A;  BMI    Body Mass Index: 23.73 kg/m      Reproductive/Obstetrics negative OB ROS                             Anesthesia Physical Anesthesia Plan  ASA: 2  Anesthesia Plan: General   Post-op Pain Management:    Induction: Intravenous  PONV Risk Score and Plan: 1 and Ondansetron and Dexamethasone  Airway Management Planned: LMA and Oral ETT  Additional Equipment:   Intra-op Plan:   Post-operative Plan: Extubation in OR  Informed Consent: I  have reviewed the patients History and Physical, chart, labs and discussed the procedure including the risks, benefits and alternatives for the proposed anesthesia with the patient or authorized representative who has indicated his/her understanding and acceptance.     Dental Advisory Given  Plan Discussed with: CRNA and Surgeon  Anesthesia Plan Comments:         Anesthesia Quick Evaluation

## 2021-09-22 NOTE — Interval H&P Note (Signed)
No change. OK to proceed.

## 2021-09-22 NOTE — Progress Notes (Signed)
°   09/22/21 0700  Clinical Encounter Type  Visited With Patient  Visit Type Pre-op;Spiritual support  Spiritual Encounters  Spiritual Needs Prayer   Chaplain provided pre-op support through compassionate presence and conversation and prayer.

## 2021-09-22 NOTE — Anesthesia Procedure Notes (Signed)
Procedure Name: Intubation Date/Time: 09/22/2021 7:45 AM Performed by: Loletha Grayer, CRNA Pre-anesthesia Checklist: Patient identified, Patient being monitored, Timeout performed, Emergency Drugs available and Suction available Patient Re-evaluated:Patient Re-evaluated prior to induction Oxygen Delivery Method: Circle system utilized Preoxygenation: Pre-oxygenation with 100% oxygen Induction Type: IV induction Ventilation: Mask ventilation without difficulty Laryngoscope Size: Mac and 3 Grade View: Grade I Tube type: Oral Tube size: 7.0 mm Number of attempts: 1 Airway Equipment and Method: Stylet Placement Confirmation: ETT inserted through vocal cords under direct vision, positive ETCO2 and breath sounds checked- equal and bilateral Secured at: 21 cm Tube secured with: Tape Dental Injury: Teeth and Oropharynx as per pre-operative assessment

## 2021-09-22 NOTE — Transfer of Care (Signed)
Immediate Anesthesia Transfer of Care Note  Patient: Ashley Hampton  Procedure(s) Performed: INCISION AND DRAINAGE ABSCESS (Left)  Patient Location: PACU  Anesthesia Type:General  Level of Consciousness: awake  Airway & Oxygen Therapy: Patient Spontanous Breathing and Patient connected to nasal cannula oxygen  Post-op Assessment: Report given to RN and Post -op Vital signs reviewed and stable  Post vital signs: Reviewed and stable  Last Vitals:  Vitals Value Taken Time  BP 107/51 09/22/21 0848  Temp    Pulse 83 09/22/21 0848  Resp 35 09/22/21 0848  SpO2 100 % 09/22/21 0848    Last Pain:  Vitals:   09/22/21 0624  TempSrc: Temporal  PainSc: 2          Complications: No notable events documented.

## 2021-09-22 NOTE — Anesthesia Postprocedure Evaluation (Signed)
Anesthesia Post Note  Patient: Ashley Hampton  Procedure(s) Performed: INCISION AND DRAINAGE ABSCESS (Left)  Patient location during evaluation: PACU Anesthesia Type: General Level of consciousness: awake and awake and alert Pain management: pain level controlled Vital Signs Assessment: post-procedure vital signs reviewed and stable Respiratory status: spontaneous breathing and respiratory function stable Cardiovascular status: stable Anesthetic complications: no   No notable events documented.   Last Vitals:  Vitals:   09/22/21 0900 09/22/21 0915  BP: (!) 107/55 102/80  Pulse: 77 65  Resp: 15 13  Temp:    SpO2: 98% 95%    Last Pain:  Vitals:   09/22/21 0916  TempSrc:   PainSc: 4                  VAN STAVEREN,Brynley Cuddeback

## 2021-09-23 ENCOUNTER — Encounter: Payer: Self-pay | Admitting: Surgery

## 2021-09-23 LAB — SURGICAL PATHOLOGY

## 2021-10-24 ENCOUNTER — Other Ambulatory Visit (HOSPITAL_COMMUNITY): Payer: Self-pay | Admitting: Surgery

## 2021-10-24 ENCOUNTER — Other Ambulatory Visit: Payer: Self-pay | Admitting: Surgery

## 2021-10-24 DIAGNOSIS — M25552 Pain in left hip: Secondary | ICD-10-CM

## 2021-11-28 ENCOUNTER — Ambulatory Visit: Admission: RE | Admit: 2021-11-28 | Payer: Medicare Other | Source: Ambulatory Visit

## 2022-06-01 ENCOUNTER — Ambulatory Visit
Admission: RE | Admit: 2022-06-01 | Discharge: 2022-06-01 | Disposition: A | Discharge status: Home / Self Care | Payer: Medicare Other | Source: Ambulatory Visit | Attending: Surgery | Admitting: Surgery

## 2022-06-01 DIAGNOSIS — M25552 Pain in left hip: Secondary | ICD-10-CM | POA: Diagnosis not present

## 2022-06-01 LAB — POCT I-STAT CREATININE: Creatinine, Ser: 1 mg/dL (ref 0.44–1.00)

## 2022-06-01 MED ORDER — IOHEXOL 300 MG/ML  SOLN
100.0000 mL | Freq: Once | INTRAMUSCULAR | Status: AC | PRN
  Administered 2022-06-01: 100 mL via INTRAVENOUS
